# Patient Record
Sex: Male | Born: 1935 | Race: White | Hispanic: No | Marital: Married | Smoking: Never smoker
Health system: Southern US, Community
[De-identification: ages and names within clinical notes are randomized; demographics above are authoritative.]

## PROBLEM LIST (undated history)

## (undated) DIAGNOSIS — I509 Heart failure, unspecified: Secondary | ICD-10-CM

## (undated) DIAGNOSIS — B029 Zoster without complications: Secondary | ICD-10-CM

## (undated) DIAGNOSIS — I4891 Unspecified atrial fibrillation: Secondary | ICD-10-CM

## (undated) DIAGNOSIS — I2699 Other pulmonary embolism without acute cor pulmonale: Secondary | ICD-10-CM

## (undated) DIAGNOSIS — I10 Essential (primary) hypertension: Secondary | ICD-10-CM

## (undated) DIAGNOSIS — E785 Hyperlipidemia, unspecified: Secondary | ICD-10-CM

## (undated) DIAGNOSIS — E039 Hypothyroidism, unspecified: Secondary | ICD-10-CM

## (undated) HISTORY — PX: TONSILLECTOMY: SUR1361

---

## 2002-08-17 ENCOUNTER — Ambulatory Visit (HOSPITAL_COMMUNITY): Admission: RE | Admit: 2002-08-17 | Discharge: 2002-08-17 | Payer: Self-pay | Admitting: Gastroenterology

## 2004-07-01 ENCOUNTER — Emergency Department (HOSPITAL_COMMUNITY): Admission: EM | Admit: 2004-07-01 | Discharge: 2004-07-01 | Payer: Self-pay | Admitting: Emergency Medicine

## 2011-02-01 ENCOUNTER — Emergency Department (HOSPITAL_COMMUNITY): Payer: Medicare Other

## 2011-02-01 ENCOUNTER — Emergency Department (HOSPITAL_COMMUNITY)
Admission: EM | Admit: 2011-02-01 | Discharge: 2011-02-02 | Disposition: A | Discharge status: Home / Self Care | Payer: Medicare Other | Attending: Emergency Medicine | Admitting: Emergency Medicine

## 2011-02-01 DIAGNOSIS — Z79899 Other long term (current) drug therapy: Secondary | ICD-10-CM | POA: Insufficient documentation

## 2011-02-01 DIAGNOSIS — H409 Unspecified glaucoma: Secondary | ICD-10-CM | POA: Insufficient documentation

## 2011-02-01 DIAGNOSIS — I4891 Unspecified atrial fibrillation: Secondary | ICD-10-CM | POA: Insufficient documentation

## 2011-02-01 DIAGNOSIS — Z7901 Long term (current) use of anticoagulants: Secondary | ICD-10-CM | POA: Insufficient documentation

## 2011-02-01 DIAGNOSIS — E7800 Pure hypercholesterolemia, unspecified: Secondary | ICD-10-CM | POA: Insufficient documentation

## 2011-02-01 DIAGNOSIS — I499 Cardiac arrhythmia, unspecified: Secondary | ICD-10-CM | POA: Insufficient documentation

## 2011-02-01 DIAGNOSIS — Z86718 Personal history of other venous thrombosis and embolism: Secondary | ICD-10-CM | POA: Insufficient documentation

## 2011-02-01 DIAGNOSIS — R079 Chest pain, unspecified: Secondary | ICD-10-CM | POA: Insufficient documentation

## 2011-02-01 DIAGNOSIS — I10 Essential (primary) hypertension: Secondary | ICD-10-CM | POA: Insufficient documentation

## 2011-02-01 LAB — POCT I-STAT, CHEM 8
Calcium, Ion: 1.18 mmol/L (ref 1.12–1.32)
Glucose, Bld: 129 mg/dL — ABNORMAL HIGH (ref 70–99)
HCT: 44 % (ref 39.0–52.0)
Hemoglobin: 15 g/dL (ref 13.0–17.0)

## 2011-02-02 LAB — CK TOTAL AND CKMB (NOT AT ARMC)
CK, MB: 2 ng/mL (ref 0.3–4.0)
Total CK: 71 U/L (ref 7–232)

## 2011-02-02 LAB — D-DIMER, QUANTITATIVE: D-Dimer, Quant: 0.39 ug/mL-FEU (ref 0.00–0.48)

## 2011-02-02 LAB — TROPONIN I: Troponin I: 0.02 ng/mL (ref 0.00–0.06)

## 2011-02-02 LAB — TSH: TSH: 0.856 u[IU]/mL (ref 0.350–4.500)

## 2011-02-02 LAB — POCT CARDIAC MARKERS
CKMB, poc: 1.2 ng/mL (ref 1.0–8.0)
Troponin i, poc: <0.05 ng/mL (ref 0.00–0.09)

## 2011-02-02 LAB — LIPID PANEL
Cholesterol: 116 mg/dL (ref 0–200)
LDL Cholesterol: 33 mg/dL (ref 0–99)
Total CHOL/HDL Ratio: 1.5 RATIO
Triglycerides: 42 mg/dL (ref <150)

## 2011-02-02 LAB — APTT: aPTT: 43 seconds — ABNORMAL HIGH (ref 24–37)

## 2011-02-18 ENCOUNTER — Inpatient Hospital Stay: Admission: AD | Admit: 2011-02-18 | Payer: Self-pay | Source: Ambulatory Visit | Admitting: Interventional Cardiology

## 2011-02-20 ENCOUNTER — Inpatient Hospital Stay (HOSPITAL_COMMUNITY): Payer: Medicare Other

## 2011-02-20 ENCOUNTER — Inpatient Hospital Stay (HOSPITAL_COMMUNITY)
Admission: AD | Admit: 2011-02-20 | Discharge: 2011-02-24 | DRG: 292 | Disposition: A | Discharge status: Home / Self Care | Payer: Medicare Other | Source: Ambulatory Visit | Attending: Interventional Cardiology | Admitting: Interventional Cardiology

## 2011-02-20 DIAGNOSIS — I509 Heart failure, unspecified: Secondary | ICD-10-CM | POA: Diagnosis present

## 2011-02-20 DIAGNOSIS — I4891 Unspecified atrial fibrillation: Secondary | ICD-10-CM | POA: Diagnosis present

## 2011-02-20 DIAGNOSIS — Z7982 Long term (current) use of aspirin: Secondary | ICD-10-CM

## 2011-02-20 DIAGNOSIS — R609 Edema, unspecified: Secondary | ICD-10-CM | POA: Diagnosis present

## 2011-02-20 DIAGNOSIS — I5031 Acute diastolic (congestive) heart failure: Principal | ICD-10-CM | POA: Diagnosis present

## 2011-02-20 DIAGNOSIS — I319 Disease of pericardium, unspecified: Secondary | ICD-10-CM | POA: Diagnosis present

## 2011-02-20 DIAGNOSIS — E871 Hypo-osmolality and hyponatremia: Secondary | ICD-10-CM | POA: Diagnosis present

## 2011-02-20 LAB — DIFFERENTIAL
Lymphs Abs: 0.7 10*3/uL (ref 0.7–4.0)
Monocytes Absolute: 1.4 10*3/uL — ABNORMAL HIGH (ref 0.1–1.0)
Monocytes Relative: 19 % — ABNORMAL HIGH (ref 3–12)
Neutro Abs: 5.2 10*3/uL (ref 1.7–7.7)

## 2011-02-20 LAB — COMPREHENSIVE METABOLIC PANEL
Alkaline Phosphatase: 186 U/L — ABNORMAL HIGH (ref 39–117)
BUN: 18 mg/dL (ref 6–23)
CO2: 31 mEq/L (ref 19–32)
Calcium: 9.4 mg/dL (ref 8.4–10.5)
GFR calc Af Amer: >60 mL/min (ref >60)
GFR calc non Af Amer: >60 mL/min (ref >60)
Potassium: 4 mEq/L (ref 3.5–5.1)
Total Protein: 6.7 g/dL (ref 6.0–8.3)

## 2011-02-20 LAB — CBC: MCHC: 31.9 g/dL (ref 30.0–36.0)

## 2011-02-20 LAB — PROTIME-INR
INR: 4.93 — ABNORMAL HIGH (ref 0.00–1.49)
Prothrombin Time: 45.7 seconds — ABNORMAL HIGH (ref 11.6–15.2)

## 2011-02-21 LAB — CBC
HCT: 36.2 % — ABNORMAL LOW (ref 39.0–52.0)
MCH: 31 pg (ref 26.0–34.0)
MCV: 96 fL (ref 78.0–100.0)
Platelets: 180 10*3/uL (ref 150–400)
RDW: 13.8 % (ref 11.5–15.5)
WBC: 6.1 10*3/uL (ref 4.0–10.5)

## 2011-02-21 LAB — BASIC METABOLIC PANEL
BUN: 16 mg/dL (ref 6–23)
CO2: 31 mEq/L (ref 19–32)
Chloride: 91 mEq/L — ABNORMAL LOW (ref 96–112)
Creatinine, Ser: 0.88 mg/dL (ref 0.4–1.5)
Potassium: 3.8 mEq/L (ref 3.5–5.1)
Sodium: 128 mEq/L — ABNORMAL LOW (ref 135–145)

## 2011-02-21 LAB — PROTIME-INR: Prothrombin Time: 41.8 seconds — ABNORMAL HIGH (ref 11.6–15.2)

## 2011-02-22 LAB — BASIC METABOLIC PANEL
BUN: 18 mg/dL (ref 6–23)
Calcium: 8.7 mg/dL (ref 8.4–10.5)
Chloride: 91 mEq/L — ABNORMAL LOW (ref 96–112)
Creatinine, Ser: 0.96 mg/dL (ref 0.4–1.5)
Glucose, Bld: 109 mg/dL — ABNORMAL HIGH (ref 70–99)
Potassium: 4.2 mEq/L (ref 3.5–5.1)

## 2011-02-22 LAB — CBC
Hemoglobin: 12.2 g/dL — ABNORMAL LOW (ref 13.0–17.0)
Platelets: 159 10*3/uL (ref 150–400)
WBC: 6.5 10*3/uL (ref 4.0–10.5)

## 2011-02-22 NOTE — Consult Note (Signed)
NAME:  Jason Ritter, Jason Ritter NO.:  [id number]  MEDICAL RECORD NO.:  [id number]           PATIENT TYPE:  E  LOCATION:  [facility]                         FACILITY:  [facility]  PHYSICIAN:  Della Goo, M.D. DATE OF BIRTH:  July 13, 1929  DATE OF CONSULTATION:  02/02/2011 DATE OF DISCHARGE:  02/02/2011                                CONSULTATION   PRIMARY CARE PHYSICIAN:  Veverly Fells. Altheimer, MD  CHIEF COMPLAINT:  Chest pain or shortness of breath.  HISTORY OF PRESENT ILLNESS:  This is a 77 year old male with a history of chronic atrial fibrillation, previous pulmonary embolism on chronic Coumadin therapy, who presents to the emergency department with complaints of worsening shortness of breath and chest pain.  The patient reports he has dyspnea on exertion that he has had for over a year.  He came into the emergency department secondary to chest discomfort, he described as chest heaviness as well as shortness of breath and when his O2 saturations were checked, he was found to be 89% in the emergency department.  The patient was placed on supplemental oxygen.  He was evaluated in the emergency department and a chest x-ray was performed as well as laboratory studies.  The chest x-ray did reveal vascular congestion and mild interstitial edema.  The patient was administered 40 mg of IV Lasix x1 dose and referred for medical admission.  His admission laboratory studies that were performed included a D-dimer which returned at 0.39 and point-of-care cardiac markers which returned negative with a myoglobin of 81.0, CK-MB 1.2, and a troponin of less than 0.05.  EKG revealed atrial fibrillation, but no acute ST-segment changes are seen.  The rate was 106.  The patient began to feel improvement after the IV Lasix had been given.  At the time of my evaluation, the patient was feeling better and wished to be discharged to home and follow up with his primary care physician, Dr.  Leslie Dales.  PAST MEDICAL HISTORY:  Significant for: 1. Chronic atrial fibrillation. 2. Hypertension. 3. Pulmonary embolism in 2006, on chronic Coumadin therapy. 4. Glaucoma. 5. Benign essential tremors. 6. Right bundle-branch block. 7. Hyperlipidemia. 8. The patient also has chronic venous insufficiency. 9. Diastolic congestive heart failure. 10.Hypothyroidism.  PAST SURGICAL HISTORY: 1. History of a tonsillectomy and adenoidectomy. 2. Rhinoplasty.  Medications at this time include amlodipine, Diovan, doxazosin, latanoprost ophthalmic drops, Lipitor, lisinopril, melatonin, metoprolol tartrate, PreserVision ophthalmic drops, stool softener, Synthroid, torsemide, and Coumadin.  ALLERGIES:  No known drug allergies.  SOCIAL HISTORY:  The patient is married.  He is a nonsmoker.  He does report drinking 6-8 ounces of gin or tequila nightly.  He denies any illicit drug usage.  FAMILY HISTORY:  Positive for coronary artery disease in his father, positive for cerebrovascular accident in his mother.  No cancer or diabetes in his family.  REVIEW OF SYSTEMS:  Pertinents as mentioned above.  All other organ systems are negative.  PHYSICAL EXAMINATION FINDINGS:  GENERAL:  This is a 77 year old elderly obese Caucasian male who is in no acute distress currently. VITAL SIGNS:  Temperature 98.1, blood pressure 135/89, heart rate  109, respirations 24, O2 saturations 89% initially, now 94%. HEENT:  Normocephalic, atraumatic.  Pupils equal, round, reactive to light.  Extraocular movements are intact.  Funduscopic benign.  There is no scleral icterus.  Nares are patent bilaterally.  Oropharynx is clear. NECK:  Supple.  Full range of motion.  No thyromegaly, adenopathy, or jugular venous distention. CARDIOVASCULAR:  Regular rate and rhythm.  No murmurs, gallops, or rubs. CHEST:  Chest wall nontender.  Normal symmetric excursion of both lung field areas.  Mild bibasilar rales.  No rhonchi, no  wheezes. ABDOMEN:  Positive bowel sounds, soft, obese, nontender, nondistended. No hepatosplenomegaly. EXTREMITIES:  With 3+ edema, bilateral lower extremities, the patient reports this is chronic. NEUROLOGIC:  Nonfocal.  LABORATORY STUDIES:  Hemoglobin 15.0, hematocrit 44.0.  Sodium 132, potassium 4.5, chloride 96, CO2 of 28, BUN 19, creatinine 1.1, and glucose 129.  D-dimer 0.39.  Point-of-care cardiac markers with a myoglobin of 81.0, CK-MB 1.2, troponin less than 0.05.  Cardiac enzymes with a total CK of 71, CK-MB 2.0, and troponin 0.02.  Protime 28.8, INR 2.70, and PTT 43.  Cholesterol panel; total cholesterol 116, LDL 33, HDL 75, triglycerides 42.  ASSESSMENT: 87. A 77 year old male being seen in the emergency department secondary     to shortness of breath which is most likely secondary to an     exacerbation of his chronic diastolic congestive heart failure     syndrome. 2. Chest pain. 3. Chronic atrial fibrillation. 4. Coagulopathy secondary to Coumadin therapy for a previous pulmonary     embolism and the atrial fibrillation.   DICTATION ENDED AT THIS POINT     Della Goo, M.D.    HJ/MEDQ  D:  02/02/2011  T:  02/02/2011  Job:  [id number]  cc:   Veverly Fells. Altheimer, M.D.  Electronically Signed by Della Goo M.D. on 02/22/2011 07:46:42 PM

## 2011-02-22 NOTE — Consult Note (Signed)
  NAME:  Jason Ritter, Jason Ritter NO.:  [id number]  MEDICAL RECORD NO.:  [id number]           PATIENT TYPE:  E  LOCATION:  [facility]                         FACILITY:  [facility]  PHYSICIAN:  Della Goo, M.D. DATE OF BIRTH:  01-24-1929  DATE OF CONSULTATION:  02/02/2011 DATE OF DISCHARGE:  02/02/2011                                CONSULTATION   ADDENDUM:  DATE OF SERVICE:  February 02, 2011.  PRIMARY CARE PHYSICIAN:  Veverly Fells. Altheimer, M.D.  PLAN:  The patient had been administered 40 mg of IV Lasix x1 dose in the emergency department and had prompt diuresis and felt better.  The patient was hemodynamically stable and did not wish to continue the cardiac workup as an inpatient.  The patient preferred to follow up with his primary care physician and could not be convinced to remain otherwise.  Recommendations are being made for the patient to have a repeat 2-D echo study performed to compare to the previous 2-D echo that patient reports having in 2006 where he stated he had an ejection fraction of 55%.  Further evaluation and referrals can be made by the patient's primary care physician.  The patient has been strongly advised to follow up with his primary care physician on Monday morning and has been also advised to return to the emergency department if his symptoms worsen.     Della Goo, M.D.     HJ/MEDQ  D:  02/02/2011  T:  02/02/2011  Job:  [id number]  cc:   Veverly Fells. Altheimer, M.D.  Electronically Signed by Della Goo M.D. on 02/22/2011 07:46:30 PM

## 2011-02-23 LAB — CBC
HCT: 37.4 % — ABNORMAL LOW (ref 39.0–52.0)
Hemoglobin: 12 g/dL — ABNORMAL LOW (ref 13.0–17.0)
MCH: 31 pg (ref 26.0–34.0)
MCHC: 32.1 g/dL (ref 30.0–36.0)

## 2011-02-23 LAB — BASIC METABOLIC PANEL
BUN: 17 mg/dL (ref 6–23)
Chloride: 90 mEq/L — ABNORMAL LOW (ref 96–112)
Glucose, Bld: 109 mg/dL — ABNORMAL HIGH (ref 70–99)
Potassium: 4.5 mEq/L (ref 3.5–5.1)

## 2011-02-24 ENCOUNTER — Inpatient Hospital Stay (HOSPITAL_COMMUNITY): Payer: Medicare Other

## 2011-02-24 LAB — CBC
HCT: 39.2 % (ref 39.0–52.0)
Hemoglobin: 13.3 g/dL (ref 13.0–17.0)
MCV: 94.9 fL (ref 78.0–100.0)
RBC: 4.13 MIL/uL — ABNORMAL LOW (ref 4.22–5.81)
WBC: 11.8 10*3/uL — ABNORMAL HIGH (ref 4.0–10.5)

## 2011-02-24 LAB — BASIC METABOLIC PANEL
BUN: 18 mg/dL (ref 6–23)
Chloride: 86 mEq/L — ABNORMAL LOW (ref 96–112)
Glucose, Bld: 105 mg/dL — ABNORMAL HIGH (ref 70–99)
Potassium: 3.1 mEq/L — ABNORMAL LOW (ref 3.5–5.1)

## 2011-03-05 NOTE — Discharge Summary (Signed)
Jason Ritter, Jason Ritter                 ACCOUNT NO.:  [id number]  MEDICAL RECORD NO.:  [id number]           PATIENT TYPE:  I  LOCATION:  4711                         FACILITY:  [facility]  PHYSICIAN:  Corky Crafts, MDDATE OF BIRTH:  [birth year]  DATE OF ADMISSION:  02/20/2011 DATE OF DISCHARGE:  02/24/2011                              DISCHARGE SUMMARY   FINAL DIAGNOSES: 1. Atrial fibrillation. 2. Acute diastolic heart failure. 3. Shortness of breath. 4. Edema.  PROCEDURES PERFORMED:  Chest x-ray showing large left pleural effusion and interstitial edema on Feb 20, 2011.  Followup chest x-ray on Feb 24, 2011, showing persistent left-sided effusion.  HOSPITAL COURSE:  The patient was admitted due to shortness of breath and significant fluid overload.  He started having issues back at the end of April.  He was seen in the emergency room and sent home.  At that time, he had a small left-sided pleural effusion.  He was seen in the office twice subsequently.  The second time, he was grossly fluid overloaded and we recommended admission to the hospital because of the social situation at home where he is the primary caretaker for his wife who has chronic illness, he declined.  He came back to the office 2 days later and had made arrangements for his wife.  He was ready to go into the hospital.  He was feeling worse.  He was admitted and started on intravenous Lasix.  He diuresed well and lost several pounds each day while he was in the hospital.  He had to have his potassium replaced. His atrial fibrillation was rate controlled.  His Coumadin was supratherapeutic, it was held for several days and then came back to the normal range.  He insisted on being discharged on Feb 24, 2011.  He had discussed leaving against medical advice with the nurse.  He understood that he still had a lot of excess fluid in his system, but felt like he had lost enough that he could go home and care for his  wife.  His INR had fallen to 1.8 at that time, but several doses had been held and he still wanted to go home.  He was willing to have close outpatient followup, therefore I agree to send him on oral diuretic.  Of note in the hospital, he responded very well to metolazone.  This seemed to be in combination with Lasix caused the greatest and most effective diuresis for the patient.  DISCHARGE MEDICATIONS: 1. Potassium 40 mEq daily. 2. Metolazone 2.5 mg p.o. every other day. 3. Metoprolol succinate 100 mg daily. 4. Doxazosin 8 mg every night. 5. He is to stop taking lisinopril. 6. He is to stop taking amlodipine. 7. Synthroid 112 mcg daily. 8. Lipitor 20 mg daily. 9. Warfarin at previous dose. 10.Latanoprost eye drops. 11.PreserVision. 12.Docusate. 13.Melatonin. 14.Lasix 80 mg p.o. b.i.d. 15.Fish oil 1000 mg b.i.d. 16.Aspirin 325 mg daily. 17.Restoril 30 mg daily.  FOLLOWUP APPOINTMENTS:  With Dr. Hoyle Barr office on Feb 27, 2011, at 9:15 a.m.  He will have lab work at that time.  DIET:  Low-salt, heart-healthy diet.  ACTIVITY:  Increase activity slowly.  Of note, we did have a social work consult while he was in the hospital. He was not interested in any services that might help relocate him and his wife to perhaps an independent living facility.  He wants to live at home, although it seems that his home situation is at least suboptimal. Home health was also offered, which he refused.  We will follow him up in the office and try and maintain him as an outpatient.  Also please note that he did have an echocardiogram a few weeks ago in our office showing normal left ventricular ejection fraction.  Greater than 30 minutes was spent on this discharge, mostly because of trying to sort out the social difficulties that he has at home.     Corky Crafts, MD     JSV/MEDQ  D:  02/25/2011  T:  02/25/2011  Job:  [id number]  Electronically Signed by Lance Muss MD  on 03/05/2011 05:03:19 PM

## 2011-09-03 ENCOUNTER — Inpatient Hospital Stay (HOSPITAL_COMMUNITY)
Admission: EM | Admit: 2011-09-03 | Discharge: 2011-09-05 | DRG: 556 | Disposition: A | Discharge status: Home / Self Care | Payer: Medicare Other | Source: Ambulatory Visit | Attending: Internal Medicine | Admitting: Internal Medicine

## 2011-09-03 ENCOUNTER — Emergency Department (HOSPITAL_COMMUNITY): Payer: Medicare Other

## 2011-09-03 ENCOUNTER — Other Ambulatory Visit: Payer: Self-pay

## 2011-09-03 ENCOUNTER — Encounter: Payer: Self-pay | Admitting: *Deleted

## 2011-09-03 DIAGNOSIS — J90 Pleural effusion, not elsewhere classified: Secondary | ICD-10-CM

## 2011-09-03 DIAGNOSIS — I4891 Unspecified atrial fibrillation: Secondary | ICD-10-CM

## 2011-09-03 DIAGNOSIS — R29898 Other symptoms and signs involving the musculoskeletal system: Principal | ICD-10-CM

## 2011-09-03 DIAGNOSIS — E785 Hyperlipidemia, unspecified: Secondary | ICD-10-CM | POA: Diagnosis present

## 2011-09-03 DIAGNOSIS — E039 Hypothyroidism, unspecified: Secondary | ICD-10-CM | POA: Diagnosis present

## 2011-09-03 DIAGNOSIS — I10 Essential (primary) hypertension: Secondary | ICD-10-CM | POA: Diagnosis present

## 2011-09-03 DIAGNOSIS — Z86718 Personal history of other venous thrombosis and embolism: Secondary | ICD-10-CM

## 2011-09-03 DIAGNOSIS — R531 Weakness: Secondary | ICD-10-CM

## 2011-09-03 DIAGNOSIS — I509 Heart failure, unspecified: Secondary | ICD-10-CM

## 2011-09-03 DIAGNOSIS — B029 Zoster without complications: Secondary | ICD-10-CM

## 2011-09-03 DIAGNOSIS — I5032 Chronic diastolic (congestive) heart failure: Secondary | ICD-10-CM | POA: Diagnosis present

## 2011-09-03 HISTORY — DX: Unspecified atrial fibrillation: I48.91

## 2011-09-03 HISTORY — DX: Heart failure, unspecified: I50.9

## 2011-09-03 HISTORY — DX: Other pulmonary embolism without acute cor pulmonale: I26.99

## 2011-09-03 HISTORY — DX: Zoster without complications: B02.9

## 2011-09-03 HISTORY — DX: Essential (primary) hypertension: I10

## 2011-09-03 HISTORY — DX: Hypothyroidism, unspecified: E03.9

## 2011-09-03 HISTORY — DX: Hyperlipidemia, unspecified: E78.5

## 2011-09-03 LAB — BASIC METABOLIC PANEL
BUN: 18 mg/dL (ref 6–23)
CO2: 27 mEq/L (ref 19–32)
Calcium: 9.2 mg/dL (ref 8.4–10.5)
Creatinine, Ser: 1.13 mg/dL (ref 0.50–1.35)

## 2011-09-03 LAB — CBC
HCT: 41.4 % (ref 39.0–52.0)
MCHC: 32.9 g/dL (ref 30.0–36.0)
MCV: 99.8 fL (ref 78.0–100.0)
RDW: 13.9 % (ref 11.5–15.5)
WBC: 6 10*3/uL (ref 4.0–10.5)

## 2011-09-03 LAB — PRO B NATRIURETIC PEPTIDE: Pro B Natriuretic peptide (BNP): 1988 pg/mL — ABNORMAL HIGH (ref 0–450)

## 2011-09-03 LAB — DIFFERENTIAL
Basophils Absolute: 0 10*3/uL (ref 0.0–0.1)
Eosinophils Relative: 1 % (ref 0–5)
Lymphocytes Relative: 26 % (ref 12–46)
Monocytes Absolute: 1 10*3/uL (ref 0.1–1.0)

## 2011-09-03 LAB — URINALYSIS, ROUTINE W REFLEX MICROSCOPIC
Glucose, UA: NEGATIVE mg/dL
Hgb urine dipstick: NEGATIVE
Specific Gravity, Urine: 1.012 (ref 1.005–1.030)
pH: 6.5 (ref 5.0–8.0)

## 2011-09-03 LAB — POCT I-STAT TROPONIN I: Troponin i, poc: 0.01 ng/mL (ref 0.00–0.08)

## 2011-09-03 NOTE — ED Notes (Signed)
Fell last night, today  Feels like his legs are "melting" and going away from him. Active shingles right flank area and abd

## 2011-09-03 NOTE — ED Provider Notes (Signed)
History    patient with history of CHF, and atrial fibrillations, is presenting to the ED with a chief complaint of weakness. Patient states for the past 2 days he has been having increase lower extremity weakness. He first notices weakness while he was standing, and he felt as if " my leg were melting away".  He has to sit down on the ground and was unable to get up. At that time, he denies falling, hitting his head, or loss of consciousness. He has to call for his wife to get him up. Today, he experienced the same symptoms with both legs giving out on him. He has to lay himself to the ground and unable to get up. Also has bowel incontinence while he was on the ground. He once again denies any fever, headache, chest pain, shortness of breath, nausea, vomiting, diarrhea, abdominal pain, numbness. Patient however, states he is taking Lasix but hadn't notice urinary frequency as he normally does. He complains of burning on urination and urinary urgency without frequency.  This is new.    Patient also states that he has a rash to his right flank that has been ongoing for the past several days. He has been seen by a dermatologist and was diagnosed with shingles. He is currently taking Valtrex, Percocet, and Neurontin. He denies significant pain radiating to his shingles.  CSN: [id number] Arrival date & time: 09/03/2011  9:44 PM   First MD Initiated Contact with Patient 09/03/11 2145      Chief Complaint  Patient presents with  . Weakness    (Consider location/radiation/quality/duration/timing/severity/associated sxs/prior treatment) Patient is a 77 y.o. male presenting with weakness. The history is provided by the patient. No language interpreter was used.  Weakness Primary symptoms do not include headaches, loss of consciousness, altered mental status, dizziness or fever. The symptoms began yesterday. The symptoms are unchanged.  Additional symptoms include weakness. Additional symptoms do not  include loss of balance or tinnitus.    Past Medical History  Diagnosis Date  . CHF (congestive heart failure)   . Afib   . Shingles   . Pulmonary embolism     History reviewed. No pertinent past surgical history.  No family history on file.  History  Substance Use Topics  . Smoking status: Not on file  . Smokeless tobacco: Not on file  . Alcohol Use:       Review of Systems  Constitutional: Negative for fever.  HENT: Negative for tinnitus.   Neurological: Positive for weakness. Negative for dizziness, loss of consciousness, headaches and loss of balance.  Psychiatric/Behavioral: Negative for altered mental status.  All other systems reviewed and are negative.    Allergies  Review of patient's allergies indicates not on file.  Home Medications  No current outpatient prescriptions on file.  BP 113/73  Pulse 82  Temp(Src) 99 F (37.2 C) (Oral)  Resp 23  SpO2 94%  Physical Exam  Nursing note and vitals reviewed. Constitutional: He is oriented to person, place, and time. He appears well-developed and well-nourished.       Awake, alert, nontoxic appearance  HENT:  Head: Normocephalic and atraumatic.  Mouth/Throat: Oropharynx is clear and moist.  Eyes: Conjunctivae and EOM are normal. Pupils are equal, round, and reactive to light. Right eye exhibits no discharge. Left eye exhibits no discharge. No scleral icterus.  Neck: Neck supple.  Cardiovascular: An irregular rhythm present.  Pulmonary/Chest: Effort normal. He exhibits no tenderness.  Abdominal: There is no tenderness. There is  no rebound.  Musculoskeletal: He exhibits no tenderness.       Right ankle: He exhibits swelling. He exhibits normal range of motion. no tenderness.       Left ankle: He exhibits swelling. He exhibits normal range of motion. no tenderness.       Baseline ROM, no obvious new focal weakness  Lymphadenopathy:    He has no cervical adenopathy.  Neurological: He is alert and oriented  to person, place, and time. He displays tremor. No cranial nerve deficit or sensory deficit. He exhibits normal muscle tone. He displays a negative Romberg sign. Gait abnormal. Coordination normal. GCS eye subscore is 4. GCS verbal subscore is 5.       Mental status and motor strength appears baseline for patient and situation  Skin: No rash noted.     Psychiatric: He has a normal mood and affect. His speech is normal and behavior is normal. Judgment and thought content normal. Cognition and memory are normal.    ED Course  Procedures (including critical care time)  Labs Reviewed - No data to display No results found.   No diagnosis found.   Date: 09/03/2011  Rate: 84  Rhythm: atrial fibrillation  QRS Axis: normal  Intervals: normal  ST/T Wave abnormalities: normal  Conduction Disutrbances:right bundle branch block  Narrative Interpretation:   Old EKG Reviewed: unchanged    MDM  Patient with new onset of weakness.  UA is negative for infection.  INR is subtherapeutic at 1.45.  CBC is negative for anemia or infection.     Patient has several bouts of bowel incontinence. Rectal exam reveals decreased rectal tone. Patient has no focal neuro deficits. This symptom is concerning for orthostatic hypotension. I will check an orthostatic vital sign. Today's chest x-ray shows recurrent large left pleural effusion with associated passive atelectasis or pneumonia left lower lobe.  I doubt that this is pneumonia as patient is afebrile, with an normal CBC, and no cough.    12:38 AM i've discussed with [facility], who has agreed to admit pt.  I also discussed with neurologist, Dr. Pearlean Brownie, who has agreed to f/u with pt in hospital once admitted.  Pt has some sensation of urinary retention with bowel incontinence.  However, no back pain.  Mild decreased rectal tone with normal rectal sensation.    Pt is mildly dehydrated but no obvious orthostasis.  Does have unsteady gait.  Pt will be  admitted.       Fayrene Helper, PA 09/04/11 0100  Fayrene Helper, PA 09/04/11 0100

## 2011-09-04 ENCOUNTER — Encounter (HOSPITAL_COMMUNITY): Payer: Self-pay | Admitting: Internal Medicine

## 2011-09-04 ENCOUNTER — Inpatient Hospital Stay (HOSPITAL_COMMUNITY): Payer: Medicare Other

## 2011-09-04 DIAGNOSIS — J90 Pleural effusion, not elsewhere classified: Secondary | ICD-10-CM | POA: Diagnosis present

## 2011-09-04 DIAGNOSIS — I509 Heart failure, unspecified: Secondary | ICD-10-CM | POA: Diagnosis present

## 2011-09-04 DIAGNOSIS — R29898 Other symptoms and signs involving the musculoskeletal system: Secondary | ICD-10-CM | POA: Diagnosis present

## 2011-09-04 DIAGNOSIS — I4891 Unspecified atrial fibrillation: Secondary | ICD-10-CM | POA: Diagnosis present

## 2011-09-04 LAB — COMPREHENSIVE METABOLIC PANEL
ALT: 24 U/L (ref 0–53)
AST: 78 U/L — ABNORMAL HIGH (ref 0–37)
Alkaline Phosphatase: 121 U/L — ABNORMAL HIGH (ref 39–117)
CO2: 27 mEq/L (ref 19–32)
Chloride: 99 mEq/L (ref 96–112)
GFR calc Af Amer: 90 mL/min — ABNORMAL LOW (ref >90)
GFR calc non Af Amer: 77 mL/min — ABNORMAL LOW (ref >90)
Glucose, Bld: 115 mg/dL — ABNORMAL HIGH (ref 70–99)
Potassium: 3.9 mEq/L (ref 3.5–5.1)
Sodium: 136 mEq/L (ref 135–145)

## 2011-09-04 LAB — VITAMIN B12: Vitamin B-12: 453 pg/mL (ref 211–911)

## 2011-09-04 LAB — CBC
HCT: 41.1 % (ref 39.0–52.0)
MCH: 33 pg (ref 26.0–34.0)
MCHC: 33.1 g/dL (ref 30.0–36.0)
RDW: 14 % (ref 11.5–15.5)

## 2011-09-04 LAB — PROTIME-INR
INR: 1.47 (ref 0.00–1.49)
Prothrombin Time: 18.1 seconds — ABNORMAL HIGH (ref 11.6–15.2)

## 2011-09-04 LAB — RPR: RPR Ser Ql: NONREACTIVE

## 2011-09-04 LAB — SEDIMENTATION RATE: Sed Rate: 38 mm/hr — ABNORMAL HIGH (ref 0–16)

## 2011-09-04 LAB — T4, FREE: Free T4: 1.14 ng/dL (ref 0.80–1.80)

## 2011-09-04 MED ORDER — LORAZEPAM 1 MG PO TABS: 1.0000 mg | ORAL_TABLET | Freq: Four times a day (QID) | ORAL | Status: DC | PRN

## 2011-09-04 MED ORDER — LORAZEPAM 2 MG/ML IJ SOLN: 1.0000 mg | Freq: Four times a day (QID) | INTRAMUSCULAR | Status: DC | PRN

## 2011-09-04 MED ORDER — LORAZEPAM 1 MG PO TABS: 0.0000 mg | ORAL_TABLET | Freq: Four times a day (QID) | ORAL | Status: DC

## 2011-09-04 MED ORDER — THIAMINE HCL 100 MG/ML IJ SOLN
100.0000 mg | Freq: Every day | INTRAMUSCULAR | Status: DC
  Filled 2011-09-04 (×2): qty 1

## 2011-09-04 MED ORDER — ZOLPIDEM TARTRATE 5 MG PO TABS: 5.0000 mg | ORAL_TABLET | Freq: Every evening | ORAL | Status: DC | PRN

## 2011-09-04 MED ORDER — LATANOPROST 0.005 % OP SOLN
1.0000 [drp] | Freq: Every day | OPHTHALMIC | Status: DC
Start: 2011-09-04 — End: 2011-09-05
  Administered 2011-09-04: 1 [drp] via OPHTHALMIC
  Filled 2011-09-04: qty 2.5

## 2011-09-04 MED ORDER — LISINOPRIL 5 MG PO TABS
5.0000 mg | ORAL_TABLET | Freq: Every day | ORAL | Status: DC
  Administered 2011-09-04 – 2011-09-05 (×2): 5 mg via ORAL
  Filled 2011-09-04 (×2): qty 1

## 2011-09-04 MED ORDER — LEVOTHYROXINE SODIUM 112 MCG PO TABS
112.0000 ug | ORAL_TABLET | Freq: Every day | ORAL | Status: DC
  Administered 2011-09-04 – 2011-09-05 (×2): 112 ug via ORAL
  Filled 2011-09-04 (×2): qty 1

## 2011-09-04 MED ORDER — MOXIFLOXACIN HCL IN NACL 400 MG/250ML IV SOLN
400.0000 mg | INTRAVENOUS | Status: DC
  Administered 2011-09-04: 400 mg via INTRAVENOUS
  Filled 2011-09-04 (×2): qty 250

## 2011-09-04 MED ORDER — SIMVASTATIN 20 MG PO TABS
20.0000 mg | ORAL_TABLET | Freq: Every day | ORAL | Status: DC
  Administered 2011-09-04: 20 mg via ORAL
  Filled 2011-09-04 (×2): qty 1

## 2011-09-04 MED ORDER — METOPROLOL SUCCINATE ER 100 MG PO TB24
100.0000 mg | ORAL_TABLET | Freq: Every day | ORAL | Status: DC
  Administered 2011-09-04 – 2011-09-05 (×2): 100 mg via ORAL
  Filled 2011-09-04 (×2): qty 1

## 2011-09-04 MED ORDER — ONDANSETRON HCL 4 MG/2ML IJ SOLN: 4.0000 mg | Freq: Four times a day (QID) | INTRAMUSCULAR | Status: DC | PRN

## 2011-09-04 MED ORDER — THERA M PLUS PO TABS
1.0000 | ORAL_TABLET | Freq: Every day | ORAL | Status: DC
  Administered 2011-09-04 – 2011-09-05 (×2): 1 via ORAL
  Filled 2011-09-04 (×2): qty 1

## 2011-09-04 MED ORDER — VALACYCLOVIR HCL 500 MG PO TABS
1000.0000 mg | ORAL_TABLET | Freq: Three times a day (TID) | ORAL | Status: DC
  Administered 2011-09-04 – 2011-09-05 (×4): 1000 mg via ORAL
  Filled 2011-09-04 (×6): qty 2

## 2011-09-04 MED ORDER — FUROSEMIDE 10 MG/ML IJ SOLN
40.0000 mg | Freq: Two times a day (BID) | INTRAMUSCULAR | Status: DC
  Administered 2011-09-04 – 2011-09-05 (×3): 40 mg via INTRAVENOUS
  Filled 2011-09-04 (×5): qty 4

## 2011-09-04 MED ORDER — TEMAZEPAM 15 MG PO CAPS: 30.0000 mg | ORAL_CAPSULE | Freq: Every evening | ORAL | Status: DC | PRN

## 2011-09-04 MED ORDER — ONDANSETRON HCL 4 MG PO TABS: 4.0000 mg | ORAL_TABLET | Freq: Four times a day (QID) | ORAL | Status: DC | PRN

## 2011-09-04 MED ORDER — ACETAMINOPHEN 325 MG PO TABS: 650.0000 mg | ORAL_TABLET | Freq: Four times a day (QID) | ORAL | Status: DC | PRN

## 2011-09-04 MED ORDER — ACETAMINOPHEN 650 MG RE SUPP: 650.0000 mg | Freq: Four times a day (QID) | RECTAL | Status: DC | PRN

## 2011-09-04 MED ORDER — LORAZEPAM 1 MG PO TABS: 0.0000 mg | ORAL_TABLET | Freq: Two times a day (BID) | ORAL | Status: DC

## 2011-09-04 MED ORDER — FOLIC ACID 1 MG PO TABS
1.0000 mg | ORAL_TABLET | Freq: Every day | ORAL | Status: DC
  Administered 2011-09-04 – 2011-09-05 (×2): 1 mg via ORAL
  Filled 2011-09-04 (×2): qty 1

## 2011-09-04 MED ORDER — SODIUM CHLORIDE 0.9 % IJ SOLN
3.0000 mL | Freq: Two times a day (BID) | INTRAMUSCULAR | Status: DC
  Administered 2011-09-04 – 2011-09-05 (×3): 3 mL via INTRAVENOUS

## 2011-09-04 MED ORDER — VITAMIN B-1 100 MG PO TABS
100.0000 mg | ORAL_TABLET | Freq: Every day | ORAL | Status: DC
  Administered 2011-09-04 – 2011-09-05 (×2): 100 mg via ORAL
  Filled 2011-09-04 (×2): qty 1

## 2011-09-04 NOTE — ED Provider Notes (Signed)
Medical screening examination/treatment/procedure(s) were performed by non-physician practitioner and as supervising physician I was immediately available for consultation/collaboration.  Jasmine Awe, MD 09/04/11 [phone]

## 2011-09-04 NOTE — Progress Notes (Signed)
Subjective: No current complaints.  Objective: Vital signs in last 24 hours: Temp:  [97.5 F (36.4 C)-99 F (37.2 C)] 97.5 F (36.4 C) (11/22 1404) Pulse Rate:  [72-96] 96  (11/22 1404) Resp:  [15-23] 20  (11/22 1404) BP: (113-157)/(73-107) 142/92 mmHg (11/22 1404) SpO2:  [93 %-97 %] 97 % (11/22 1404) Weight:  [95.5 kg (210 lb 8.6 oz)] 210 lb 8.6 oz (95.5 kg) (11/22 0600) Weight change:  Last BM Date: 09/03/11  Intake/Output from previous day: 11/21 0701 - 11/22 0700 In: 364 [P.O.:110; IV Piggyback:254] Out: 550 [Urine:550] Total I/O In: 462 [P.O.:462] Out: 1275 [Urine:1275]   Physical Exam: General: Alert, awake, oriented x3, in no acute distress. HEENT: No bruits, no goiter. Heart: Regular rate and rhythm, without murmurs, rubs, gallops. Lungs: Clear to auscultation bilaterally. Abdomen: Soft, nontender, nondistended, positive bowel sounds. Extremities: No clubbing cyanosis or edema with positive pedal pulses. Neuro: Grossly intact, nonfocal.    Lab Results: Basic Metabolic Panel:  Basename 09/04/11 0600 09/03/11 2205  NA 136 134*  K 3.9 3.8  CL 99 96  CO2 27 27  GLUCOSE 115* 120*  BUN 16 18  CREATININE 0.93 1.13  CALCIUM 8.9 9.2  MG 2.1 --  PHOS -- --   Liver Function Tests:  Basename 09/04/11 0600  AST 78*  ALT 24  ALKPHOS 121*  BILITOT 0.8  PROT 6.7  ALBUMIN 3.3*   CBC:  Basename 09/04/11 0600 09/03/11 2205  WBC 5.2 6.0  NEUTROABS -- 3.4  HGB 13.6 13.6  HCT 41.1 41.4  MCV 99.8 99.8  PLT 123* 136*   BNP:  Basename 09/03/11 2205  POCBNP 1988.0*   Thyroid Function Tests:  Basename 09/04/11 1000  TSH --  T4TOTAL --  FREET4 1.14  T3FREE --  THYROIDAB --   Coagulation:  Basename 09/04/11 0600 09/03/11 2205  LABPROT 18.1* 17.9*  INR 1.47 1.45    Recent Results (from the past 240 hour(s))  MRSA PCR SCREENING     Status: Abnormal   Collection Time   09/04/11  6:15 AM      Component Value Range Status Comment   MRSA by PCR  POSITIVE (*) NEGATIVE  Final     Studies/Results: Dg Chest 2 View  09/03/2011  *RADIOLOGY REPORT*  Clinical Data: Active shingles.  Generalized weakness.  Atrial fibrillation.  History of CHF.  CHEST - 2 VIEW 09/03/2011:  Comparison: Two-view chest x-ray 02/24/2011, 02/20/2011, and portable chest x-ray 02/02/2011 [facility]  Findings: Residual/recurrent large left pleural effusion and associated dense consolidation in the left lower lobe.  Calcified granulomata in the right lower lobe.  Emphysematous changes throughout both lungs.  Lungs otherwise clear.  Pulmonary vascularity normal.  Cardiac silhouette enlarged but stable. Thoracic aorta mildly tortuous and atherosclerotic, unchanged. Hilar and mediastinal contours otherwise unremarkable. Degenerative changes throughout the thoracic spine.  IMPRESSION: Residual/recurrent large left pleural effusion and associated dense passive atelectasis and/or pneumonia in the left lower lobe. Stable cardiomegaly without pulmonary edema.  COPD/emphysema.  Old granulomatous disease.  Original Report Authenticated By: Arnell Sieving, M.D.   Ct Head Wo Contrast  09/03/2011  *RADIOLOGY REPORT*  Clinical Data: Generalized weakness.  Multiple recent falls. Shingles.  CT HEAD WITHOUT CONTRAST 09/03/2011:  Technique:  Contiguous axial images were obtained from the base of the skull through the vertex without contrast.  Comparison: None.  Findings: Motion degraded several of the images initially but these were repeated and a diagnostic study was obtained.  Moderate to severe cortical  atrophy, severe deep atrophy, and moderate cerebellar atrophy.  Mild changes of small vessel disease of the white matter diffusely. No mass lesion.  No midline shift. No acute hemorrhage or hematoma.  No extra-axial fluid collections. No evidence of acute infarction.  No focal osseous abnormality involving the skull.  Visualized paranasal sinuses, mastoid air cells, and middle ear  cavities well- aerated.  Bilateral carotid siphon atherosclerosis.  IMPRESSION:  1.  No acute intracranial abnormality. 2.  Moderate to severe generalized atrophy and mild chronic microvascular ischemic changes of the white matter.  Original Report Authenticated By: Arnell Sieving, M.D.   Ct Chest Wo Contrast  09/04/2011  *RADIOLOGY REPORT*  Clinical Data: Shortness of breath.  Left pleural effusion.  CT CHEST WITHOUT CONTRAST  Technique:  Multidetector CT imaging of the chest was performed following the standard protocol without IV contrast.  Comparison: None.  Findings: A moderate-to-large left pleural effusion is seen with compressive atelectasis of the left lower lobe.  A tiny right pleural effusion is seen.  There is no evidence of pulmonary consolidation or mass.  No evidence of centrally obstructing mass or lymphadenopathy on this noncontrast study.  Shotty mediastinal lymph nodes are seen, none of which are pathologically enlarged. No adenopathy seen elsewhere within the thorax.  Mild cardiomegaly noted with mild coronary artery calcification. No evidence of pericardial effusion.  Right lower lobe calcified granuloma and right hilar lymph node calcifications, consistent with old granulomatous disease.  Several noncalcified pulmonary nodules are seen in the right lung, largest in the right lower lobe measuring 7 mm on image 47.  IMPRESSION:  1.  Moderate-to-large left pleural effusion and left lower lobe compressive atelectasis. 2.  No evidence of centrally obstructing mass or lymphadenopathy. 3.  Several noncalcified indeterminate right lung nodules, largest measuring 7 mm. If the patient is at high risk for bronchogenic carcinoma, follow-up chest CT at 3-6 months is recommended.  If the patient is at low risk for bronchogenic carcinoma, follow-up chest CT at 6-12 months is recommended.  This recommendation follows the consensus statement: Guidelines for Management of Small Pulmonary Nodules Detected  on CT Scans: A Statement from the Fleischner Society as published in Radiology 2005; 237:395-400. Online at: DietDisorder.cz.  Original Report Authenticated By: Danae Orleans, M.D.    Medications: Scheduled Meds:   . folic acid  1 mg Oral Daily  . furosemide  40 mg Intravenous Q12H  . latanoprost  1 drop Left Eye QHS  . levothyroxine  112 mcg Oral Daily  . lisinopril  5 mg Oral Daily  . LORazepam  0-4 mg Oral Q6H   Followed by  . LORazepam  0-4 mg Oral Q12H  . metoprolol  100 mg Oral Daily  . moxifloxacin  400 mg Intravenous Q24H  . multivitamins ther. w/minerals  1 tablet Oral Daily  . simvastatin  20 mg Oral q1800  . sodium chloride  3 mL Intravenous Q12H  . thiamine  100 mg Oral Daily   Or  . thiamine  100 mg Intravenous Daily  . valACYclovir  1,000 mg Oral TID   Continuous Infusions:  PRN Meds:.acetaminophen, acetaminophen, LORazepam, LORazepam, ondansetron (ZOFRAN) IV, ondansetron, zolpidem, DISCONTD: temazepam  Assessment/Plan:  Principal Problem:  *Lower extremity weakness Active Problems:  Pleural effusion on left  CHF (congestive heart failure)  A-fib  #1 bilateral lower extremity weakness: Appreciate neurology input. MRI of thoracic and lumbar spine is pending. I have added a vitamin B 12 level and a TSH. Symptoms coincide with  initiation of Percocet and Neurontin. These medications have already been discontinued. Further recommendations as per neurology. PT and OT evaluations are pending.  #2 large left pleural effusion: He is remarkably asymptomatic. No shortness of breath or chest pain. This is likely a transudative effusion related to his diastolic CHF. He will probably benefit from a thoracentesis prior to discharge. Discontinue Avelox as CT scan is without evidence for pneumonia.  #3 acute on chronic diastolic CHF: Continue Lasix, strive for negative fluid balance.  #4 shingles of the T9 dermatome: They are no longer  painful, they are pretty much scabbed over at this time. He does not require airborne isolation. Continue Valtrex as prescribed by dermatology.   LOS: 1 day   Jason Ritter,Jason Ritter Formisano 09/04/2011, 3:45 PM

## 2011-09-04 NOTE — H&P (Addendum)
Jason Ritter is an 77 y.o. male.   Chief Complaint: Falls HPI: 74 yar old male with history of diastolic heart failure, afib, anti thrombin 3 deficiency and history of pulmonary embolism on coumadin and INR is subtherapeutic presents with  Having had atleast three falls in the last 24 hours. He states he had his first fall at 1 am yesterday when he was wlaking his legs gave way suddenly. After which he ws not able to get up. EMS was called and patient was placed on his bed and refused to come to ER. Same thing happened again at 8am. EMS was called he refused to come to ER again. Later he did drive himself to his cardiologists office around 11am. Later in the evening at home while walking he had a fall similarly to first two time but this time in addition he had bowel incontinence. This time he agreed to come to ER. In ER he had CT head which is negative and chest xray shows large left pleural effusion. Neurologist on callwas called by EDP. At this time hospitalist was requested to admit and neurologist will be seeing patient in consult.  Of not patient has been diagnosed with shingles five days ago and is on valcyclovir.  Past Medical History  Diagnosis Date  . CHF (congestive heart failure)   . Afib   . Shingles   . Pulmonary embolism   . Hyperlipidemia   . Glaucoma     Past Surgical History  Procedure Date  . Tonsillectomy     Family History  Problem Relation Age of Onset  . Stroke Mother   . Heart failure Father   . Cancer Sister    Social History:  reports that he has never smoked. He does not have any smokeless tobacco history on file. He reports that he drinks alcohol. He reports that he does not use illicit drugs.  Allergies: No Known Allergies  No current facility-administered medications on file as of 09/03/2011.   No current outpatient prescriptions on file as of 09/03/2011.    Results for orders placed during the hospital encounter of 09/03/11 (from the past 48 hour(s))   CBC     Status: Abnormal   Collection Time   09/03/11 10:05 PM      Component Value Range Comment   WBC 6.0  4.0 - 10.5 (K/uL)    RBC 4.15 (*) 4.22 - 5.81 (MIL/uL)    Hemoglobin 13.6  13.0 - 17.0 (g/dL)    HCT 16.1  09.6 - 04.5 (%)    MCV 99.8  78.0 - 100.0 (fL)    MCH 32.8  26.0 - 34.0 (pg)    MCHC 32.9  30.0 - 36.0 (g/dL)    RDW 40.9  81.1 - 91.4 (%)    Platelets 136 (*) 150 - 400 (K/uL)   DIFFERENTIAL     Status: Abnormal   Collection Time   09/03/11 10:05 PM      Component Value Range Comment   Neutrophils Relative 57  43 - 77 (%)    Neutro Abs 3.4  1.7 - 7.7 (K/uL)    Lymphocytes Relative 26  12 - 46 (%)    Lymphs Abs 1.5  0.7 - 4.0 (K/uL)    Monocytes Relative 17 (*) 3 - 12 (%)    Monocytes Absolute 1.0  0.1 - 1.0 (K/uL)    Eosinophils Relative 1  0 - 5 (%)    Eosinophils Absolute 0.0  0.0 - 0.7 (K/uL)  Basophils Relative 0  0 - 1 (%)    Basophils Absolute 0.0  0.0 - 0.1 (K/uL)   BASIC METABOLIC PANEL     Status: Abnormal   Collection Time   09/03/11 10:05 PM      Component Value Range Comment   Sodium 134 (*) 135 - 145 (mEq/L)    Potassium 3.8  3.5 - 5.1 (mEq/L)    Chloride 96  96 - 112 (mEq/L)    CO2 27  19 - 32 (mEq/L)    Glucose, Bld 120 (*) 70 - 99 (mg/dL)    BUN 18  6 - 23 (mg/dL)    Creatinine, Ser 8.29  0.50 - 1.35 (mg/dL)    Calcium 9.2  8.4 - 10.5 (mg/dL)    GFR calc non Af Amer 59 (*) >90 (mL/min)    GFR calc Af Amer 69 (*) >90 (mL/min)   PRO B NATRIURETIC PEPTIDE     Status: Abnormal   Collection Time   09/03/11 10:05 PM      Component Value Range Comment   BNP, POC 1988.0 (*) 0 - 450 (pg/mL)   PROTIME-INR     Status: Abnormal   Collection Time   09/03/11 10:05 PM      Component Value Range Comment   Prothrombin Time 17.9 (*) 11.6 - 15.2 (seconds)    INR 1.45  0.00 - 1.49    URINALYSIS, ROUTINE W REFLEX MICROSCOPIC     Status: Normal   Collection Time   09/03/11 10:26 PM      Component Value Range Comment   Color, Urine YELLOW  YELLOW      Appearance CLEAR  CLEAR     Specific Gravity, Urine 1.012  1.005 - 1.030     pH 6.5  5.0 - 8.0     Glucose, UA NEGATIVE  NEGATIVE (mg/dL)    Hgb urine dipstick NEGATIVE  NEGATIVE     Bilirubin Urine NEGATIVE  NEGATIVE     Ketones, ur NEGATIVE  NEGATIVE (mg/dL)    Protein, ur NEGATIVE  NEGATIVE (mg/dL)    Urobilinogen, UA 0.2  0.0 - 1.0 (mg/dL)    Nitrite NEGATIVE  NEGATIVE     Leukocytes, UA NEGATIVE  NEGATIVE  MICROSCOPIC NOT DONE ON URINES WITH NEGATIVE PROTEIN, BLOOD, LEUKOCYTES, NITRITE, OR GLUCOSE <1000 mg/dL.  POCT I-STAT TROPONIN I     Status: Normal   Collection Time   09/03/11 10:35 PM      Component Value Range Comment   Troponin i, poc 0.01  0.00 - 0.08 (ng/mL)    Comment 3             Dg Chest 2 View  09/03/2011  *RADIOLOGY REPORT*  Clinical Data: Active shingles.  Generalized weakness.  Atrial fibrillation.  History of CHF.  CHEST - 2 VIEW 09/03/2011:  Comparison: Two-view chest x-ray 02/24/2011, 02/20/2011, and portable chest x-ray 02/02/2011 [facility].  Findings: Residual/recurrent large left pleural effusion and associated dense consolidation in the left lower lobe.  Calcified granulomata in the right lower lobe.  Emphysematous changes throughout both lungs.  Lungs otherwise clear.  Pulmonary vascularity normal.  Cardiac silhouette enlarged but stable. Thoracic aorta mildly tortuous and atherosclerotic, unchanged. Hilar and mediastinal contours otherwise unremarkable. Degenerative changes throughout the thoracic spine.  IMPRESSION: Residual/recurrent large left pleural effusion and associated dense passive atelectasis and/or pneumonia in the left lower lobe. Stable cardiomegaly without pulmonary edema.  COPD/emphysema.  Old granulomatous disease.  Original Report Authenticated By: Marya Landry  Lyman Bishop, M.D.   Ct Head Wo Contrast  09/03/2011  *RADIOLOGY REPORT*  Clinical Data: Generalized weakness.  Multiple recent falls. Shingles.  CT HEAD WITHOUT CONTRAST 09/03/2011:   Technique:  Contiguous axial images were obtained from the base of the skull through the vertex without contrast.  Comparison: None.  Findings: Motion degraded several of the images initially but these were repeated and a diagnostic study was obtained.  Moderate to severe cortical atrophy, severe deep atrophy, and moderate cerebellar atrophy.  Mild changes of small vessel disease of the white matter diffusely. No mass lesion.  No midline shift. No acute hemorrhage or hematoma.  No extra-axial fluid collections. No evidence of acute infarction.  No focal osseous abnormality involving the skull.  Visualized paranasal sinuses, mastoid air cells, and middle ear cavities well- aerated.  Bilateral carotid siphon atherosclerosis.  IMPRESSION:  1.  No acute intracranial abnormality. 2.  Moderate to severe generalized atrophy and mild chronic microvascular ischemic changes of the white matter.  Original Report Authenticated By: Arnell Sieving, M.D.    Review of Systems  HENT: Negative.   Eyes: Negative.   Respiratory: Negative.   Cardiovascular: Negative.   Gastrointestinal: Negative.   Genitourinary:       Difficulty urinating   Musculoskeletal: Positive for falls.  Skin: Negative.   Neurological: Positive for weakness.       Lower extremity weakness on walking  Endo/Heme/Allergies: Negative.   Psychiatric/Behavioral: Negative.     Blood pressure 129/77, pulse 95, temperature 99 F (37.2 C), temperature source Oral, resp. rate 15, SpO2 94.00%. Physical Exam  Constitutional: He is oriented to person, place, and time. He appears well-developed and well-nourished.  HENT:  Head: Normocephalic and atraumatic.  Right Ear: External ear normal.  Left Ear: External ear normal.  Nose: Nose normal.  Mouth/Throat: Oropharynx is clear and moist.  Eyes: Conjunctivae are normal. Pupils are equal, round, and reactive to light.  Neck: Normal range of motion. Neck supple.  Cardiovascular: Normal rate,  regular rhythm, normal heart sounds and intact distal pulses.   Respiratory: Effort normal and breath sounds normal.  GI: Soft. Bowel sounds are normal.  Musculoskeletal: He exhibits edema. He exhibits no tenderness.  Neurological: He is alert and oriented to person, place, and time.       Is able to move upper and lower extrmities 5/5 Is able to stand Has no reflexes Sensation is present.  Skin: There is erythema.       Has rash on the right flank area from recent shingles  Psychiatric: His behavior is normal.     Assessment/Plan 1)Lower extremity weakness 2)Large left pleural effusion 3)H/O Afib and Pulmonary embolism and anti thrombin 3 deficiency on coumadin 4)On coumadin with sub therapeutic INR 5)H/O Diastolic CHF 6)Recently diagnosed with shingles on valcyclovir. 7)Chronic alcoholism  Plan: I did discuss with on call neurologist Dr Pearlean Brownie. For now we are ordering MRI Lumbar and thoracic spine with and  without contrast to rule out cord compression or myelitis Will hold off coumadin until then, and there is also a possibility patient may need lumbar puncture or thoracentesis. Patient has large pleural effusion which may be from his CHF. The chest xray is read as possibility of pneumonia also. For now I am placing on avelox and lasix as iv. Will get ct chest to further study his pleural effusion particularly to make sure there is no loculations. Will continue his valcyclovir for the shingles. Will be on contact and airborne precautions for the  same. Neurologist to see in AM. Alcohol withrawal protocol.  Breyer Tejera N. 09/04/2011, 1:52 AM

## 2011-09-04 NOTE — Progress Notes (Signed)
Lab called with results of MRSA PCR. PCR was positive. Contact precautions instituded per protocol. Peter Congo RN

## 2011-09-04 NOTE — Consult Note (Signed)
[facility] Consult Note  Date: 09/04/2011  Patient name: Jason Ritter Medical record number: [id number] Date of birth: March 28, 1929 Age: 77 y.o. Gender: male    Reason For Consult:  LE weakness   Chief Complaint: Recurrent falls History of Present Illness: Jason Ritter is an 77 y.o. male  Admitted to the hospital due to recurrent falls.  Describes sudden onset weakness bilateral LE's with inability to hold his own weight causing him to fall or be unable to get up from standing position.  No associated pain, numbness or tingling.  Does stand with wide base, as he feels he will topple over with feet closer together.   No back pain.  No precipitating sx of dizziness, vertigo, palpitations or near syncope.  Had 1 episode of bowel incontinence with a fall.  Has had greater than 1 year progressive difficulty starting urine stream and emptying bladder.  Has some dysuria.   Has not seen a urologist.    1 year ago was able to walk 1 1/2 miles without difficulty.  Due to CHF and LE edema, can now walk only short distances.  Denies claudication sx.  Does not use a cane or walker.  Still drives.  No accidents.  Developed shingles (R T9-10 Dermatome ) and began valcyclovir 5 days ago.  Was on Neurontin x 3 days-  Stopped Tuesday due to possibly aggravating falls.  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  Neurology consult called to eval LE weakness.   Meds Inpatient  Scheduled:   . folic acid  1 mg Oral Daily  . furosemide  40 mg Intravenous Q12H  . latanoprost  1 drop Left Eye QHS  . levothyroxine  112 mcg Oral Daily  . lisinopril  5 mg Oral Daily  . LORazepam  0-4 mg Oral Q6H   Followed by  . LORazepam  0-4 mg Oral Q12H  . metoprolol  100 mg Oral Daily  . moxifloxacin  400 mg Intravenous Q24H  . multivitamins ther. w/minerals  1 tablet Oral Daily  . simvastatin  20 mg Oral q1800  . sodium chloride  3 mL Intravenous Q12H  . thiamine  100 mg Oral Daily   Or  . thiamine   100 mg Intravenous Daily  . valACYclovir  1,000 mg Oral TID    Prior to Admission medications   Medication Sig Start Date End Date Taking? Authorizing Provider  atorvastatin (LIPITOR) 10 MG tablet Take 10 mg by mouth daily.     Yes Historical Provider, MD  furosemide (LASIX) 40 MG tablet Take 40 mg by mouth 2 (two) times daily.     Yes Historical Provider, MD  latanoprost (XALATAN) 0.005 % ophthalmic solution Place 1 drop into the left eye at bedtime.     Yes Historical Provider, MD  levothyroxine (SYNTHROID, LEVOTHROID) 112 MCG tablet Take 112 mcg by mouth daily.     Yes Historical Provider, MD  lisinopril (PRINIVIL,ZESTRIL) 5 MG tablet Take 5 mg by mouth daily.     Yes Historical Provider, MD  metoprolol (TOPROL-XL) 100 MG 24 hr tablet Take 100 mg by mouth every morning.     Yes Historical Provider, MD  temazepam (RESTORIL) 30 MG capsule Take 30 mg by mouth at bedtime as needed. For sleep.    Yes Historical Provider, MD  valACYclovir (VALTREX) 500 MG tablet Take 1,000 mg by mouth 3 (three) times daily. For 7 days. Has one dose left today, and then will have 2 days left. Should end  on 09/05/11    Yes Historical Provider, MD  warfarin (COUMADIN) 4 MG tablet Take 4 mg by mouth daily.     Yes Historical Provider, MD     Allergies: Review of patient's allergies indicates no known allergies.  Past Medical History  Diagnosis Date  . CHF (congestive heart failure)   . Afib   . Shingles   . Pulmonary embolism   . Hyperlipidemia   . Glaucoma   . Hypothyroidism   . Hypertension    Past Surgical History  Procedure Date  . Tonsillectomy    Family History  Problem Relation Age of Onset  . Stroke Mother   . Heart failure Father   . Cancer Sister    Social History: Married.  Lives with wife in multi-level home.  No children.  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  Dissertation on [place].   Reports that he has never smoked. He does not have any smokeless tobacco history on  file. He reports that he drinks about 4.2 ounces of alcohol per week.  He reports that he does not use illicit drugs.  Review of Systems: See HPI/  No CP or SOB.  Recent 65 lb fluid wt loss due to diuresis from CHF, per pt.     Examination:  Blood pressure 157/84, pulse 82, temperature 97.9 F (36.6 C), temperature source Oral, resp. rate 20, height 6' (1.829 m), weight 95.5 kg (210 lb 8.6 oz), SpO2 96.00%. In general, pleasant and conversant.  Cardiovascular: 2+ pitting edema ankles down bilaterally.  Unable to palpate distal pulses due to edema.   Skin: dry red papular rash R T 9-10 dermatome.  Atrophic skin changes LE's with hypertrophic toe nails.  Mental status:   The patient is oriented to person, place and time. Recent and remote memory are intact. Attention span and concentration are normal. Language including repetition, naming, following commands are intact. Fund of knowledge of current and historical events, as well as vocabulary are normal. No aphasia or dysarthria.  Cranial Nerves: Pupils are equally round and reactive to light. Visual fields full to confrontation. Extraocular movements are intact without nystagmus. Facial sensation and muscles of mastication are intact. Muscles of facial expression are symmetric.  Tongue protrusion midline.  Shoulder shrug intact.  Motor:  5/5 upper and LE strength bilaterally.  The patient has normal bulk and tone of all extremities, no pronator drift.  Tremor of upper extremities, present at rest and with activity.  Reflexes: Biceps  Triceps Brachioradialis Knee Ankle  Right            2+  2+  2+   0          0  Left             2+  2+  2+   0 0  Plantars: mute. (toes upgoing at rest bilaterally)  Coordination:  Normal finger to nose/ heel to shin.  No dysdiadokinesia. Rapid alternating movements intact.  Sensation: Diminished vibratory sense below ankles and finger tips bilaterally.  Positional sense, discrimination intact. No gross  sensory level.  Gait:  Wide based unsteady gait, + Rhomberg.  Labs: CBC    Component Value Date/Time   WBC 5.2 09/04/2011 0600   RBC 4.12* 09/04/2011 0600   HGB 13.6 09/04/2011 0600   HCT 41.1 09/04/2011 0600   PLT 123* 09/04/2011 0600   MCV 99.8 09/04/2011 0600   MCH 33.0 09/04/2011 0600   MCHC 33.1 09/04/2011 0600   RDW  14.0 09/04/2011 0600   LYMPHSABS 1.5 09/03/2011 2205   MONOABS 1.0 09/03/2011 2205   EOSABS 0.0 09/03/2011 2205   BASOSABS 0.0 09/03/2011 2205   Chemistry    Component Value Date/Time   NA 136 09/04/2011 0600   K 3.9 09/04/2011 0600   CL 99 09/04/2011 0600   CO2 27 09/04/2011 0600   GLUCOSE 115* 09/04/2011 0600   BUN 16 09/04/2011 0600   CREATININE 0.93 09/04/2011 0600   CALCIUM 8.9 09/04/2011 0600   MG 2.1 09/04/2011 0600   AST 78* 09/04/2011 0600   ALT 24 09/04/2011 0600   ALKPHOS 121* 09/04/2011 0600   BILITOT 0.8 09/04/2011 0600   PROT 6.7 09/04/2011 0600   ALBUMIN 3.3* 09/04/2011 0600   Lipid Panel    Component Value Date/Time   CHOL  Value: 116        ATP III CLASSIFICATION:  <200     mg/dL   Desirable  161-096  mg/dL   Borderline High  >=045    mg/dL   High        01/19/8118 0208   TRIG 42 02/02/2011 0208   HDL 75 02/02/2011 0208   CHOLHDL 1.5 02/02/2011 0208   VLDL 8 02/02/2011 0208   LDLCALC  Value: 33        Total Cholesterol/HDL:CHD Risk Coronary Heart Disease Risk Table                     Men   Women  1/2 Average Risk   3.4   3.3  Average Risk       5.0   4.4  2 X Average Risk   9.6   7.1  3 X Average Risk  23.4   11.0        Use the calculated Patient Ratio above and the CHD Risk Table to determine the patient's CHD Risk.        ATP III CLASSIFICATION (LDL):  <100     mg/dL   Optimal  147-829  mg/dL   Near or Above                    Optimal  130-159  mg/dL   Borderline  562-130  mg/dL   High  >865     mg/dL   Very High 7/84/6962 9528   A1C No results found for this basename: HGBA1C   Cardiac Enzymes    Component Value Date/Time    CKTOTAL 71 02/02/2011 0208   CKMB 2.0 02/02/2011 0208   TROPONINI  Value: 0.02        NO INDICATION OF MYOCARDIAL INJURY. 02/02/2011 0208   Thyroid    Component Value Date/Time   TSH 0.856 02/02/2011 0208   Anemia No results found for this basename: VitaminB12, Folate, Ferritin, TIBC, Iron, reticctpct   Micro No results found for this or any previous visit (from the past 240 hour(s)).  Imaging: Dg Chest 2 View  09/03/2011  *RADIOLOGY REPORT*  Clinical Data: Active shingles.  Generalized weakness.  Atrial fibrillation.  History of CHF.  CHEST - 2 VIEW 09/03/2011:  Comparison: Two-view chest x-ray 02/24/2011, 02/20/2011, and portable chest x-ray 02/02/2011 [facility].  Findings: Residual/recurrent large left pleural effusion and associated dense consolidation in the left lower lobe.  Calcified granulomata in the right lower lobe.  Emphysematous changes throughout both lungs.  Lungs otherwise clear.  Pulmonary vascularity normal.  Cardiac silhouette enlarged but stable. Thoracic aorta mildly tortuous and atherosclerotic, unchanged.  Hilar and mediastinal contours otherwise unremarkable. Degenerative changes throughout the thoracic spine.  IMPRESSION: Residual/recurrent large left pleural effusion and associated dense passive atelectasis and/or pneumonia in the left lower lobe. Stable cardiomegaly without pulmonary edema.  COPD/emphysema.  Old granulomatous disease.  Original Report Authenticated By: Arnell Sieving, M.D.   Ct Head Wo Contrast  09/03/2011  *RADIOLOGY REPORT*  Clinical Data: Generalized weakness.  Multiple recent falls. Shingles.  CT HEAD WITHOUT CONTRAST 09/03/2011:  Technique:  Contiguous axial images were obtained from the base of the skull through the vertex without contrast.  Comparison: None.  Findings: Motion degraded several of the images initially but these were repeated and a diagnostic study was obtained.  Moderate to severe cortical atrophy, severe deep atrophy,  and moderate cerebellar atrophy.  Mild changes of small vessel disease of the white matter diffusely. No mass lesion.  No midline shift. No acute hemorrhage or hematoma.  No extra-axial fluid collections. No evidence of acute infarction.  No focal osseous abnormality involving the skull.  Visualized paranasal sinuses, mastoid air cells, and middle ear cavities well- aerated.  Bilateral carotid siphon atherosclerosis.  IMPRESSION:  1.  No acute intracranial abnormality. 2.  Moderate to severe generalized atrophy and mild chronic microvascular ischemic changes of the white matter.  Original Report Authenticated By: Arnell Sieving, M.D.    Assessment Mr. Jason Ritter is a 77 y.o. male with acute onset inability to maintain weight against gravity, resulting in fall x 3.  May have had a similar episode 3-4 months ago.  Neuro exam most consistent with sensory neuropathy/ ataxia.  Cannot exclude orthostatic tremor/ hypotension or exacerbation of sx due to gabapentin use.   Exam not consistent with Guillian Barre syndrome or cord compression, making these differentials less likely.  Pt denies history of diabetes.  Recommendations:  1. MRI w/wo contrast T&L spine  2. If MRI unremarkable will recommend LP  3.B12, Folate, ESR, RPR, SPEP 4.orthostatic BP/HR 5. May need outpatient EMG if w/u inconclusive.  Thank you for having [place] see Jason Ritter in consultation.  Will follow with you.  Please call with any questions.   LOS: 1 day   Jana Hakim [facility] [phone] 09/04/2011  8:17 AM

## 2011-09-05 ENCOUNTER — Inpatient Hospital Stay (HOSPITAL_COMMUNITY): Payer: Medicare Other

## 2011-09-05 MED ORDER — GADOBENATE DIMEGLUMINE 529 MG/ML IV SOLN
20.0000 mL | Freq: Once | INTRAVENOUS | Status: AC
  Administered 2011-09-05: 20 mL via INTRAVENOUS

## 2011-09-05 NOTE — Progress Notes (Addendum)
Subjective: Continues to have weakness bilateral LE and decreased sensation in feet to mid calf  Objective: Vital signs in last 24 hours: Temp:  [97.5 F (36.4 C)-98.2 F (36.8 C)] 98.2 F (36.8 C) (11/23 0532) Pulse Rate:  [70-96] 80  (11/23 1132) Resp:  [20-21] 21  (11/23 0532) BP: (122-156)/(74-96) 122/76 mmHg (11/23 1132) SpO2:  [96 %-97 %] 97 % (11/23 0532) Weight:  [95.89 kg (211 lb 6.4 oz)] 211 lb 6.4 oz (95.89 kg) (11/23 0532)  Intake/Output from previous day: 11/22 0701 - 11/23 0700 In: 822 [P.O.:822] Out: 1725 [Urine:1725] Intake/Output this shift: Total I/O In: 240 [P.O.:240] Out: 500 [Urine:500] Nutritional status: Cardiac  Past Medical History  Diagnosis Date  . CHF (congestive heart failure)   . Afib   . Shingles   . Pulmonary embolism   . Hyperlipidemia   . Glaucoma   . Hypothyroidism   . Hypertension     Neurologic Exam:  Mental Status: Alert, oriented, thought content appropriate.  Speech fluent without evidence of aphasia. Able to follow 3 step commands without difficulty. Cranial Nerves: II-Visual fields grossly intact. III/IV/VI-Extraocular movements intact.  Pupils reactive bilaterally. V/VII-Smile symmetric VIII-grossly intact IX/X-normal gag XI-bilateral shoulder shrug XII-midline tongue extension Motor: 5/5 bilaterally with normal tone and bulk Sensory: Pinprick and light touch intact throughout, bilaterally--decreased sensation in feet up to mid calf bilaterally Deep Tendon Reflexes: 2+ and symmetric throughout upper extremities.  No reflexes appreciated in bilateral patella and achilles.   Plantars: mute bilaterally Cerebellar: Normal finger-to-nose, normal rapid alternating movements and normal heel-to-shin test.  Wide based gait and station.      Lab Results:  Basename 09/04/11 0600 09/03/11 2205  WBC 5.2 6.0  HGB 13.6 13.6  HCT 41.1 41.4  PLT 123* 136*  NA 136 134*  K 3.9 3.8  CL 99 96  CO2 27 27  GLUCOSE 115* 120*  BUN  16 18  CREATININE 0.93 1.13  CALCIUM 8.9 9.2  LABA1C -- --   Lipid Panel No results found for this basename: CHOL,TRIG,HDL,CHOLHDL,VLDL,LDLCALC in the last 72 hours  Studies/Results: Dg Chest 2 View  09/03/2011  *RADIOLOGY REPORT*  Clinical Data: Active shingles.  Generalized weakness.  Atrial fibrillation.  History of CHF.  CHEST - 2 VIEW 09/03/2011:  Comparison: Two-view chest x-ray 02/24/2011, 02/20/2011, and portable chest x-ray 02/02/2011 [facility].  Findings: Residual/recurrent large left pleural effusion and associated dense consolidation in the left lower lobe.  Calcified granulomata in the right lower lobe.  Emphysematous changes throughout both lungs.  Lungs otherwise clear.  Pulmonary vascularity normal.  Cardiac silhouette enlarged but stable. Thoracic aorta mildly tortuous and atherosclerotic, unchanged. Hilar and mediastinal contours otherwise unremarkable. Degenerative changes throughout the thoracic spine.  IMPRESSION: Residual/recurrent large left pleural effusion and associated dense passive atelectasis and/or pneumonia in the left lower lobe. Stable cardiomegaly without pulmonary edema.  COPD/emphysema.  Old granulomatous disease.  Original Report Authenticated By: Arnell Sieving, M.D.   Ct Head Wo Contrast  09/03/2011  *RADIOLOGY REPORT*  Clinical Data: Generalized weakness.  Multiple recent falls. Shingles.  CT HEAD WITHOUT CONTRAST 09/03/2011:  Technique:  Contiguous axial images were obtained from the base of the skull through the vertex without contrast.  Comparison: None.  Findings: Motion degraded several of the images initially but these were repeated and a diagnostic study was obtained.  Moderate to severe cortical atrophy, severe deep atrophy, and moderate cerebellar atrophy.  Mild changes of small vessel disease of the white matter diffusely. No mass  lesion.  No midline shift. No acute hemorrhage or hematoma.  No extra-axial fluid collections. No evidence of  acute infarction.  No focal osseous abnormality involving the skull.  Visualized paranasal sinuses, mastoid air cells, and middle ear cavities well- aerated.  Bilateral carotid siphon atherosclerosis.  IMPRESSION:  1.  No acute intracranial abnormality. 2.  Moderate to severe generalized atrophy and mild chronic microvascular ischemic changes of the white matter.  Original Report Authenticated By: Arnell Sieving, M.D.   Ct Chest Wo Contrast  09/04/2011  *RADIOLOGY REPORT*  Clinical Data: Shortness of breath.  Left pleural effusion.  CT CHEST WITHOUT CONTRAST  Technique:  Multidetector CT imaging of the chest was performed following the standard protocol without IV contrast.  Comparison: None.  Findings: A moderate-to-large left pleural effusion is seen with compressive atelectasis of the left lower lobe.  A tiny right pleural effusion is seen.  There is no evidence of pulmonary consolidation or mass.  No evidence of centrally obstructing mass or lymphadenopathy on this noncontrast study.  Shotty mediastinal lymph nodes are seen, none of which are pathologically enlarged. No adenopathy seen elsewhere within the thorax.  Mild cardiomegaly noted with mild coronary artery calcification. No evidence of pericardial effusion.  Right lower lobe calcified granuloma and right hilar lymph node calcifications, consistent with old granulomatous disease.  Several noncalcified pulmonary nodules are seen in the right lung, largest in the right lower lobe measuring 7 mm on image 47.  IMPRESSION:  1.  Moderate-to-large left pleural effusion and left lower lobe compressive atelectasis. 2.  No evidence of centrally obstructing mass or lymphadenopathy. 3.  Several noncalcified indeterminate right lung nodules, largest measuring 7 mm. If the patient is at high risk for bronchogenic carcinoma, follow-up chest CT at 3-6 months is recommended.  If the patient is at low risk for bronchogenic carcinoma, follow-up chest CT at 6-12  months is recommended.  This recommendation follows the consensus statement: Guidelines for Management of Small Pulmonary Nodules Detected on CT Scans: A Statement from the Fleischner Society as published in Radiology 2005; 237:395-400. Online at: DietDisorder.cz.  Original Report Authenticated By: Danae Orleans, M.D.   Mr Lumbar Spine W Wo Contrast  09/05/2011  *RADIOLOGY REPORT*  Clinical Data: Cord compression.  Back pain and bowel incontinence.  MRI LUMBAR SPINE WITHOUT AND WITH CONTRAST  Technique:  Multiplanar and multiecho pulse sequences of the lumbar spine were obtained without and with intravenous contrast.  Contrast: 20mL MULTIHANCE GADOBENATE DIMEGLUMINE 529 MG/ML IV SOLN  Comparison: None.  Findings: The urinary bladder is markedly distended.  The patient may benefit from Foley catheter placement.  There is a dextroconvex lumbar scoliosis with the apex at L2.  Partially visualized cystic lesion is present in the inferior left kidney.  The spinal cord terminates posterior to the T12-L1 interspace.  Multilevel lumbar spondylosis is present. There is heterogenous marrow with loss of normal fatty marrow signal, which is a nonspecific finding. It is commonly associated with anemia, chronic disease, obesity, cigarette smoking.  Whole body height is preserved.  There are no compression fractures.  No compression of the cauda equina  T12-L1:  Disc desiccation and degeneration without stenosis.  L1-L2:  Shallow left eccentric disc bulging without stenosis.  Left lateral osteophytes are present.  Probable left lateral disc protrusion extending into the medial left psoas muscle.  L2-L3:  Disc desiccation with broad-based posterior disc bulge. Mild central stenosis associated with bulging disc and facet hypertrophy as well as ligamentum flavum redundancy.  Crowding of the left lateral recess without neural compression.  Mild left foraminal stenosis.  Right foramen patent.   L3-L4:  Disc desiccation with circumferential disc bulge.  Facet hypertrophy.  Mild central stenosis.  Foramina patent.  L4-L5:  There is high signal within the disc.  There appears to be ankylosis across the disc space on the right.  High signal in the this likely represents ossification.  No enhancement to suggest infection.  The central canal is patent.  The foramina appear adequately patent.  There is an osteophyte in the left lateral region contacting the exiting left L4 nerve.  L5-S1:  Severe disc desiccation and degeneration.  The foramina are patent.  Large osteophytes are present anteriorly and to the right in the lateral region potentially irritating the right L5 nerve.   After Gadolinium administration, there is no pathologic enhancement.    IMPRESSION: 1.  Marked distention of the urinary bladder.  No cauda equina compression. 2.  Multilevel lumbar spondylosis as described above.  Original Report Authenticated By: Andreas Newport, M.D.    Medications:  Scheduled:   . folic acid  1 mg Oral Daily  . furosemide  40 mg Intravenous Q12H  . gadobenate dimeglumine  20 mL Intravenous Once  . latanoprost  1 drop Left Eye QHS  . levothyroxine  112 mcg Oral Daily  . lisinopril  5 mg Oral Daily  . LORazepam  0-4 mg Oral Q6H   Followed by  . LORazepam  0-4 mg Oral Q12H  . metoprolol  100 mg Oral Daily  . multivitamins ther. w/minerals  1 tablet Oral Daily  . simvastatin  20 mg Oral q1800  . sodium chloride  3 mL Intravenous Q12H  . thiamine  100 mg Oral Daily   Or  . thiamine  100 mg Intravenous Daily  . valACYclovir  1,000 mg Oral TID  . DISCONTD: moxifloxacin  400 mg Intravenous Q24H    Assessment/Plan:  Jason Ritter is a 77 y.o. male with acute onset inability to maintain weight against gravity, resulting in fall x 3. May have had a similar episode 3-4 months ago. Neuro exam most consistent with sensory neuropathy/ ataxia. Cannot exclude orthostatic tremor/ hypotension or  exacerbation of sx due to gabapentin use.   TSH 1.085 RPR (-) MRI L and T spine shows no cord abnormality  After further discussion with patient and Dr. Lyman Speller it is felt that his symptom history is not indicative of GBS and more of a chronic sensory ataxia. The episode of leg heaviness are discrete periods in time when he felt that legs were heavy with resolution in between. Currently, he says that he is back to baseline. We do not feel that LP at this time is warranted. However, he has been documented to have orthostasis and I suspect his symptoms of perceived leg heaviness may be related to that. This matter should be addressed. Patient should also follow up with outpatient neurology to be evaluated for EMG/NCS and to have follow up. Call with questions.   Felicie Morn PA-C [facility] [phone]  09/05/2011, 11:35 AM

## 2011-09-05 NOTE — Discharge Summary (Signed)
Physician Discharge Summary  Patient ID: Jason Ritter MRN: [id number] DOB/AGE: [birth year] 77 y.o.  Admit date: 09/03/2011 Discharge date: 09/05/2011  Primary Care Physician:  No primary provider on file.   Discharge Diagnoses:    Principal Problem:  *Lower extremity weakness Active Problems:  Pleural effusion on left  CHF (congestive heart failure)  A-fib Chronic ETOH use   Current Discharge Medication List    CONTINUE these medications which have NOT CHANGED   Details  atorvastatin (LIPITOR) 10 MG tablet Take 10 mg by mouth daily.      furosemide (LASIX) 40 MG tablet Take 40 mg by mouth 2 (two) times daily.      latanoprost (XALATAN) 0.005 % ophthalmic solution Place 1 drop into the left eye at bedtime.      levothyroxine (SYNTHROID, LEVOTHROID) 112 MCG tablet Take 112 mcg by mouth daily.      lisinopril (PRINIVIL,ZESTRIL) 5 MG tablet Take 5 mg by mouth daily.      metoprolol (TOPROL-XL) 100 MG 24 hr tablet Take 100 mg by mouth every morning.      temazepam (RESTORIL) 30 MG capsule Take 30 mg by mouth at bedtime as needed. For sleep.     warfarin (COUMADIN) 4 MG tablet Take 4 mg by mouth as directed. Patient takes 4 mg daily Tuesday through Sunday and 2 mg on Monday. Last INR in clinic 2.4.       STOP taking these medications     valACYclovir (VALTREX) 500 MG tablet          Disposition and Follow-up:  Patient to be discharged home today in stable and improved condition. Followup with his primary care physician and with his cardiologist, Dr. Velna Ochs, as scheduled.   Consults:  neurology Dr. Lyman Speller   Significant Diagnostic Studies:  Dg Chest 2 View  09/03/2011  *RADIOLOGY REPORT*  Clinical Data: Active shingles.  Generalized weakness.  Atrial fibrillation.  History of CHF.  CHEST - 2 VIEW 09/03/2011:  Comparison: Two-view chest x-ray 02/24/2011, 02/20/2011, and portable chest x-ray 02/02/2011 [facility].  Findings: Residual/recurrent large left  pleural effusion and associated dense consolidation in the left lower lobe.  Calcified granulomata in the right lower lobe.  Emphysematous changes throughout both lungs.  Lungs otherwise clear.  Pulmonary vascularity normal.  Cardiac silhouette enlarged but stable. Thoracic aorta mildly tortuous and atherosclerotic, unchanged. Hilar and mediastinal contours otherwise unremarkable. Degenerative changes throughout the thoracic spine.  IMPRESSION: Residual/recurrent large left pleural effusion and associated dense passive atelectasis and/or pneumonia in the left lower lobe. Stable cardiomegaly without pulmonary edema.  COPD/emphysema.  Old granulomatous disease.  Original Report Authenticated By: Arnell Sieving, M.D.   Ct Head Wo Contrast  09/03/2011  *RADIOLOGY REPORT*  Clinical Data: Generalized weakness.  Multiple recent falls. Shingles.  CT HEAD WITHOUT CONTRAST 09/03/2011:  Technique:  Contiguous axial images were obtained from the base of the skull through the vertex without contrast.  Comparison: None.  Findings: Motion degraded several of the images initially but these were repeated and a diagnostic study was obtained.  Moderate to severe cortical atrophy, severe deep atrophy, and moderate cerebellar atrophy.  Mild changes of small vessel disease of the white matter diffusely. No mass lesion.  No midline shift. No acute hemorrhage or hematoma.  No extra-axial fluid collections. No evidence of acute infarction.  No focal osseous abnormality involving the skull.  Visualized paranasal sinuses, mastoid air cells, and middle ear cavities well- aerated.  Bilateral carotid siphon atherosclerosis.  IMPRESSION:  1.  No acute intracranial abnormality. 2.  Moderate to severe generalized atrophy and mild chronic microvascular ischemic changes of the white matter.  Original Report Authenticated By: Arnell Sieving, M.D.   Ct Chest Wo Contrast  09/04/2011  *RADIOLOGY REPORT*  Clinical Data: Shortness of  breath.  Left pleural effusion.  CT CHEST WITHOUT CONTRAST  Technique:  Multidetector CT imaging of the chest was performed following the standard protocol without IV contrast.  Comparison: None.  Findings: A moderate-to-large left pleural effusion is seen with compressive atelectasis of the left lower lobe.  A tiny right pleural effusion is seen.  There is no evidence of pulmonary consolidation or mass.  No evidence of centrally obstructing mass or lymphadenopathy on this noncontrast study.  Shotty mediastinal lymph nodes are seen, none of which are pathologically enlarged. No adenopathy seen elsewhere within the thorax.  Mild cardiomegaly noted with mild coronary artery calcification. No evidence of pericardial effusion.  Right lower lobe calcified granuloma and right hilar lymph node calcifications, consistent with old granulomatous disease.  Several noncalcified pulmonary nodules are seen in the right lung, largest in the right lower lobe measuring 7 mm on image 47.  IMPRESSION:  1.  Moderate-to-large left pleural effusion and left lower lobe compressive atelectasis. 2.  No evidence of centrally obstructing mass or lymphadenopathy. 3.  Several noncalcified indeterminate right lung nodules, largest measuring 7 mm. If the patient is at high risk for bronchogenic carcinoma, follow-up chest CT at 3-6 months is recommended.  If the patient is at low risk for bronchogenic carcinoma, follow-up chest CT at 6-12 months is recommended.  This recommendation follows the consensus statement: Guidelines for Management of Small Pulmonary Nodules Detected on CT Scans: A Statement from the Fleischner Society as published in Radiology 2005; 237:395-400. Online at: DietDisorder.cz.  Original Report Authenticated By: Danae Orleans, M.D.   Mr Thoracic Spine W Wo Contrast  09/05/2011  *RADIOLOGY REPORT*  Clinical Data: Compression fracture.  Fall.  Back pain.  Bowel incontinence.  MRI  THORACIC SPINE WITHOUT AND WITH CONTRAST  Technique:  Multiplanar and multiecho pulse sequences of the thoracic spine were obtained without and with intravenous contrast.  Contrast:  20 ml Multihance.  Comparison: Chest CT 09/04/2011.  Findings: Mildly exaggerated thoracic kyphosis.  2 mm anterolisthesis of T2 on T3.  Thoracic spine DISH is present. Ossification of the T9-T10 disc.  Benign vertebral body hemangiomata are present. There is heterogenous marrow with loss of normal fatty marrow signal, which is a nonspecific finding. It is commonly associated with anemia, chronic disease, obesity, cigarette smoking.  There is no abnormal enhancement after Gadolinium administration. Of the thoracic cord shows normal caliber and intramedullary signal.  The trace anterolisthesis of C7-9 T1 is present with posterior ligamentum flavum redundancy that produces a mild impression on the posterior thecal sac.  Large left pleural effusion is present.  Associated atelectasis.  Upper lumbar scoliosis is partially visualized.  Vertebral body height is preserved.  There is no compression fracture or bone marrow edema.  C7-T1:  Posterior ligamentum flavum redundancy with mild central stenosis.  T1-T2:  Shallow right eccentric broad-based disc osteophyte complex.  Very mild central stenosis.  T2-T3:  Trace anterolisthesis with uncoverage of the disc.  The shallow disc osteophyte complex with mild central stenosis.  T3-T4:  Negative.  T4-T5:  Negative.  T5-T6:  The negative.  T6-T7:  Negative.  T7-T8:  Negative.  T8-T9:  Schmorl's node in the inferior T8 endplate.  No stenosis.  T9-T10:  Ossified disc.  No stenosis.  T10-T11:  Mild right posterior ligamentum flavum redundancy and facet hypertrophy.  No stenosis.  T11-T12:  Mild facet degeneration.  IMPRESSION: No thoracic cord compression.  Thoracic spine DISH.  Upper thoracic spine and lower cervical degenerative disease with mild central stenosis.  Original Report Authenticated By:  Andreas Newport, M.D.   Mr Lumbar Spine W Wo Contrast  09/05/2011  *RADIOLOGY REPORT*  Clinical Data: Cord compression.  Back pain and bowel incontinence.  MRI LUMBAR SPINE WITHOUT AND WITH CONTRAST  Technique:  Multiplanar and multiecho pulse sequences of the lumbar spine were obtained without and with intravenous contrast.  Contrast: 20mL MULTIHANCE GADOBENATE DIMEGLUMINE 529 MG/ML IV SOLN  Comparison: None.  Findings: The urinary bladder is markedly distended.  The patient may benefit from Foley catheter placement.  There is a dextroconvex lumbar scoliosis with the apex at L2.  Partially visualized cystic lesion is present in the inferior left kidney.  The spinal cord terminates posterior to the T12-L1 interspace.  Multilevel lumbar spondylosis is present. There is heterogenous marrow with loss of normal fatty marrow signal, which is a nonspecific finding. It is commonly associated with anemia, chronic disease, obesity, cigarette smoking.  Whole body height is preserved.  There are no compression fractures.  No compression of the cauda equina  T12-L1:  Disc desiccation and degeneration without stenosis.  L1-L2:  Shallow left eccentric disc bulging without stenosis.  Left lateral osteophytes are present.  Probable left lateral disc protrusion extending into the medial left psoas muscle.  L2-L3:  Disc desiccation with broad-based posterior disc bulge. Mild central stenosis associated with bulging disc and facet hypertrophy as well as ligamentum flavum redundancy.  Crowding of the left lateral recess without neural compression.  Mild left foraminal stenosis.  Right foramen patent.  L3-L4:  Disc desiccation with circumferential disc bulge.  Facet hypertrophy.  Mild central stenosis.  Foramina patent.  L4-L5:  There is high signal within the disc.  There appears to be ankylosis across the disc space on the right.  High signal in the this likely represents ossification.  No enhancement to suggest infection.  The  central canal is patent.  The foramina appear adequately patent.  There is an osteophyte in the left lateral region contacting the exiting left L4 nerve.  L5-S1:  Severe disc desiccation and degeneration.  The foramina are patent.  Large osteophytes are present anteriorly and to the right in the lateral region potentially irritating the right L5 nerve.   After Gadolinium administration, there is no pathologic enhancement.  IMPRESSION: 1.  Marked distention of the urinary bladder.  No cauda equina compression. 2.  Multilevel lumbar spondylosis as described above.  Original Report Authenticated By: Andreas Newport, M.D.    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 Because of this her asked to admit her for further evaluation.    Hospital Course:  Principal Problem:  *Lower extremity weakness Active Problems:  Pleural effusion on left  CHF (congestive heart failure)  A-fib  #1 falls/lower extremity weakness: TSH and vitamin B 12 are within normal limits. MRI of the thoracic and lumbar spine only showed evidence for multilevel spondylosis without explanation for his lower  extremity weakness. His exam is most consistent with peripheral neuropathy which I believe is likely secondary to long-standing alcoholism. He is agreeable with home health PT which I will set up prior to discharge.  #2 moderate sized left pleural effusion: He is completely asymptomatic. Denies any chest pain or shortness of breath at rest or with exertion. This is likely a transudative effusion related to his diastolic CHF. I do not believe a thoracentesis is indicated at this time.   #3 T9 dermatome shingles: He has completed a course of  Valtrex as indicated by his term at colleges.  #4 chronic diastolic CHF. Has not been active this admission. He is to continue his home doses of Lasix.  Time spent on Discharge: Greater than 30 minutes.  SignedChaya Jan 09/05/2011, 12:31 PM

## 2011-09-05 NOTE — Progress Notes (Signed)
09/05/2011 Eliz Nigg, Bosie Clos [facility] Case Management Note [phone]       Attempted to go in and speak with patient this afternoon at 1402 about home health services. Patient states,"I am not able to talk to you right now. I have a car waiting on me. I told them that I would have to figure out my schedule next week." Patient standing at the bedside putting a belt on. Attempted to explain need to establish home health prior to discharging, and patient stated, " I just can't have time to talk with you right now. I have the form. I know that [facility] is the first one. I will deal with this next week. I have to go." Wished the patient well. Notified Dr. Ardyth Harps of conversation. No further needs identified. Patient being discharged home.

## 2011-09-05 NOTE — Progress Notes (Signed)
Jason Ritter to be D/C'd Home per MD order.  Discussed prescriptions and follow up appointments with the patient. Prescriptions given to patient, medication list explained in detail. Pt verbalized understanding.   Jason Ritter, Jason Ritter  Home Medication Instructions ZOX:[id number]   Printed on:09/05/11 1321  Medication Information                    metoprolol (TOPROL-XL) 100 MG 24 hr tablet Take 100 mg by mouth every morning.             lisinopril (PRINIVIL,ZESTRIL) 5 MG tablet Take 5 mg by mouth daily.             atorvastatin (LIPITOR) 10 MG tablet Take 10 mg by mouth daily.             temazepam (RESTORIL) 30 MG capsule Take 30 mg by mouth at bedtime as needed. For sleep.            levothyroxine (SYNTHROID, LEVOTHROID) 112 MCG tablet Take 112 mcg by mouth daily.             furosemide (LASIX) 40 MG tablet Take 40 mg by mouth 2 (two) times daily.             latanoprost (XALATAN) 0.005 % ophthalmic solution Place 1 drop into the left eye at bedtime.             warfarin (COUMADIN) 4 MG tablet Take 4 mg by mouth as directed. Patient takes 4 mg daily Tuesday through Sunday and 2 mg on Monday. Last INR in clinic 2.4.              Filed Vitals:   09/05/11 1132  BP: 122/76  Pulse: 80  Temp:   Resp:     Skin clean, dry and intact without evidence of skin break down, no evidence of skin tears noted. IV catheter discontinued intact. Site without signs and symptoms of complications. Dressing and pressure applied. Bilateral lower extremity swelling. Shingles on trunk. Pt denies pain at this time. No complaints noted. Pt wishes to have case management set up via phone after admission. Case manager notified. An After Visit Summary was printed and given to the patient. Patient escorted via WC, and D/C home via private auto.  [place], [employer] 09/05/2011 1:21 PM

## 2011-09-08 LAB — HEAVY METALS SCREEN, URINE: Arsenic, 24H Ur: 9 mcg/L (ref <81)

## 2011-09-10 LAB — IMMUNOFIXATION ELECTROPHORESIS
IgA: 327 mg/dL (ref 68–379)
IgM, Serum: 159 mg/dL (ref 41–251)
Total Protein ELP: 6.7 g/dL (ref 6.0–8.3)

## 2011-11-20 ENCOUNTER — Other Ambulatory Visit: Payer: Self-pay | Admitting: Endocrinology

## 2011-11-20 DIAGNOSIS — R9389 Abnormal findings on diagnostic imaging of other specified body structures: Secondary | ICD-10-CM

## 2011-11-20 DIAGNOSIS — R634 Abnormal weight loss: Secondary | ICD-10-CM

## 2011-12-08 ENCOUNTER — Ambulatory Visit
Admission: RE | Admit: 2011-12-08 | Discharge: 2011-12-08 | Disposition: A | Discharge status: Home / Self Care | Payer: Medicare Other | Source: Ambulatory Visit | Attending: Endocrinology | Admitting: Endocrinology

## 2011-12-08 DIAGNOSIS — R9389 Abnormal findings on diagnostic imaging of other specified body structures: Secondary | ICD-10-CM

## 2011-12-08 DIAGNOSIS — R634 Abnormal weight loss: Secondary | ICD-10-CM

## 2011-12-08 MED ORDER — IOHEXOL 300 MG/ML  SOLN
125.0000 mL | Freq: Once | INTRAMUSCULAR | Status: AC | PRN
  Administered 2011-12-08: 125 mL via INTRAVENOUS

## 2012-10-01 ENCOUNTER — Encounter (HOSPITAL_BASED_OUTPATIENT_CLINIC_OR_DEPARTMENT_OTHER): Payer: Medicare Other

## 2012-10-29 ENCOUNTER — Encounter (HOSPITAL_BASED_OUTPATIENT_CLINIC_OR_DEPARTMENT_OTHER): Payer: Medicare Other

## 2013-07-18 ENCOUNTER — Ambulatory Visit (INDEPENDENT_AMBULATORY_CARE_PROVIDER_SITE_OTHER): Payer: 59 | Admitting: Pharmacist

## 2013-07-18 DIAGNOSIS — I2699 Other pulmonary embolism without acute cor pulmonale: Secondary | ICD-10-CM

## 2013-07-18 DIAGNOSIS — I4891 Unspecified atrial fibrillation: Secondary | ICD-10-CM

## 2013-08-08 ENCOUNTER — Other Ambulatory Visit: Payer: Self-pay | Admitting: Interventional Cardiology

## 2013-09-22 ENCOUNTER — Encounter: Payer: Self-pay | Admitting: Interventional Cardiology

## 2013-09-23 ENCOUNTER — Encounter: Payer: Self-pay | Admitting: Interventional Cardiology

## 2013-09-23 ENCOUNTER — Ambulatory Visit (INDEPENDENT_AMBULATORY_CARE_PROVIDER_SITE_OTHER): Payer: 59 | Admitting: Interventional Cardiology

## 2013-09-23 ENCOUNTER — Ambulatory Visit (INDEPENDENT_AMBULATORY_CARE_PROVIDER_SITE_OTHER): Payer: 59 | Admitting: Pharmacist

## 2013-09-23 VITALS — BP 120/76 | HR 75 | Ht 72.0 in | Wt 207.0 lb

## 2013-09-23 DIAGNOSIS — I4891 Unspecified atrial fibrillation: Secondary | ICD-10-CM

## 2013-09-23 DIAGNOSIS — I10 Essential (primary) hypertension: Secondary | ICD-10-CM | POA: Insufficient documentation

## 2013-09-23 DIAGNOSIS — I5032 Chronic diastolic (congestive) heart failure: Secondary | ICD-10-CM | POA: Insufficient documentation

## 2013-09-23 DIAGNOSIS — E782 Mixed hyperlipidemia: Secondary | ICD-10-CM

## 2013-09-23 DIAGNOSIS — I2699 Other pulmonary embolism without acute cor pulmonale: Secondary | ICD-10-CM

## 2013-09-23 DIAGNOSIS — R609 Edema, unspecified: Secondary | ICD-10-CM | POA: Insufficient documentation

## 2013-09-23 NOTE — Patient Instructions (Signed)
Your physician recommends that you continue on your current medications as directed. Please refer to the Current Medication list given to you today.  Your physician wants you to follow-up in: 6 months with Dr. Varanasi.  You will receive a reminder letter in the mail two months in advance. If you don't receive a letter, please call our office to schedule the follow-up appointment.  

## 2013-09-23 NOTE — Progress Notes (Signed)
Patient ID: Jason Ritter, male   DOB: 01/13/1929, 79 y.o.   MRN: [id number]    [address] [place], [place]  [address] Phone: [phone] Fax:  [phone]  Date:  09/23/2013   ID:  Jason Ritter, DOB 04-19-1929, MRN [id number]  PCP:  Pearla Dubonnet, MD      History of Present Illness: Jason Ritter is a 79 y.o. male with known diastolic dysfunction and atrial fibrillation. His shortness of breath but this improved. He is able to lie flat. he does not report any chest pain. He has lost weight. He went to [place] and went to the [employer] and had some swelling. This is gettting better. He feels that his fluid status is better.  His INR was 4.4 today.  His gait is more steady as well.  He took a trip to [place] as well.   No bleeding problems.       Wt Readings from Last 3 Encounters:  09/23/13 207 lb (93.895 kg)  09/05/11 211 lb 6.4 oz (95.89 kg)     Past Medical History  Diagnosis Date  . CHF (congestive heart failure)   . Afib   . Shingles   . Pulmonary embolism   . Hyperlipidemia   . Glaucoma   . Hypothyroidism   . Hypertension     Current Outpatient Prescriptions  Medication Sig Dispense Refill  . AFLURIA PRESERVATIVE FREE injection       . amLODipine (NORVASC) 2.5 MG tablet Take 2.5 mg by mouth daily.       [name] atorvastatin (LIPITOR) 10 MG tablet Take 10 mg by mouth daily.        . furosemide (LASIX) 40 MG tablet Take 40 mg by mouth 2 (two) times daily.        [name] levothyroxine (SYNTHROID, LEVOTHROID) 112 MCG tablet Take 112 mcg by mouth daily.        [name] lisinopril (PRINIVIL,ZESTRIL) 5 MG tablet Take 5 mg by mouth daily.        [name] LUMIGAN 0.01 % SOLN Place 1 drop into the right eye at bedtime.       . metoprolol succinate (TOPROL-XL) 100 MG 24 hr tablet TAKE 1 TABLET DAILY.  90 tablet  0  . temazepam (RESTORIL) 30 MG capsule Take 30 mg by mouth at bedtime as needed. For sleep.       [name] warfarin (COUMADIN) 4 MG tablet Take 4 mg by mouth as  directed. Patient takes 4 mg daily Tuesday through Sunday and 2 mg on Monday. Last INR in clinic 2.4.        No current facility-administered medications for this visit.    Allergies:   No Known Allergies  Social History:  The patient  reports that he has never smoked. He does not have any smokeless tobacco history on file. He reports that he drinks about 4.2 ounces of alcohol per week. He reports that he does not use illicit drugs.   Family History:  The patient's family history includes Cancer in his sister; Heart failure in his father; Stroke in his mother.   ROS:  Please see the history of present illness.  No nausea, vomiting.  No fevers, chills.  No focal weakness.  No dysuria. Mild edema in bilateral lower extremities.  More energy.   All other systems reviewed and negative.   PHYSICAL EXAM: VS:  BP 120/76  Pulse 75  Ht 6' (1.829 m)  Wt 207 lb (93.895  kg)  BMI 28.07 kg/m2 Well nourished, well developed, in no acute distress HEENT: normal Neck: no JVD, no carotid bruits Cardiac:  normal S1, S2; Irregularly irregular Lungs:  clear to auscultation bilaterally, no wheezing, rhonchi or rales Abd: soft, nontender, no hepatomegaly Ext: mild bilateral edema Skin: warm and dry Neuro:   no focal abnormalities noted  EKG:  AFib , RBBB   , no ST segment changes  ASSESSMENT AND PLAN:  Atrial fibrillation, persistent  Notes: Coumadin for stroke prevention.  encouraged him to try to exercise to improve stamina. He can also try low weights to build muscle.  2. Essential hypertension, benign  Notes: Controlled today.  3. Hypercholesteremia  Continue Atorvastatin Calcium, 10 MG, 1 tablet, once a day Notes: Lipids well controlled. He does not need his lipids checked in July.  May 2014 LDL 56, HDL 57.  4. Diastolic CHF  Continue Furosemide Tablet, 40 MG, 2 tablets, twice a day Notes: Seems euvolemic.  5. Anticoagulant monitoring  Notes: INR to be checked in Coumadin dose to be  adjusted with Pharm.D. Please see his note for details. Low AT III by prior hypercoag w/u at time of PE.     Signed, Fredric Mare, MD, [facility] 09/23/2013 12:40 PM

## 2013-10-03 ENCOUNTER — Telehealth: Payer: Self-pay | Admitting: Pharmacist

## 2013-10-03 ENCOUNTER — Ambulatory Visit (INDEPENDENT_AMBULATORY_CARE_PROVIDER_SITE_OTHER): Payer: 59 | Admitting: Pharmacist

## 2013-10-03 DIAGNOSIS — I2699 Other pulmonary embolism without acute cor pulmonale: Secondary | ICD-10-CM

## 2013-10-03 DIAGNOSIS — I4891 Unspecified atrial fibrillation: Secondary | ICD-10-CM

## 2013-10-03 LAB — POCT INR: INR: 2.2

## 2013-10-03 NOTE — Telephone Encounter (Addendum)
Patient currently on amlodipine 2.5 mg qd, metoprolol 100 mg daily, and lisinopril 5 mg qd.  Patient stated he fell last week and he thinks it was due to low blood pressure.  His blood pressure has been ~ 120/74 in the office, and he also states it has been ~ 120/74 at home as well, but he isn't checking his blood pressure that often.  He is on coumadin for AFib/PE.  He states he has fainted twice over past 2 months.  Patient and I discussed his situation at length.  He didn't have these falling episodes before being put on amlodipine.    Plan: 1.  I advised patient to stop the amlodipine for now, and check his blood pressure twice daily for the next week. 2.  If SBP gets > 160 mmHg he agrees to restart amlodipine 2.5 mg qd. 3.  He will call in 1 week with his BP results and let us know if he has fainted again.  To Dr. Eldridge Dace as Lorain Childes.

## 2013-10-03 NOTE — Patient Instructions (Addendum)
Plan: 1.  I advised patient to stop the amlodipine for now, and check his blood pressure twice daily for the next week. 2.  If SBP gets > 160 mmHg he agrees to restart amlodipine 2.5 mg qd. 3.  He will call in 1 week with his BP results and let us know if he has fainted again

## 2013-10-05 NOTE — Telephone Encounter (Signed)
Agree 

## 2013-10-10 ENCOUNTER — Telehealth: Payer: Self-pay | Admitting: Pharmacist

## 2013-10-10 NOTE — Telephone Encounter (Signed)
Patient stopped his amlodipine 2.5 mg qd last week due to two different "fainting" spells in mid November and mid December.  He is still on metoprolol and lisinopril.  His home blood pressures since being off amlodipine average ~ 135/85.  He checked his BP 60 times over past week, and only seven times was the SBP over 150.  These were all in the low 150's.  His pulse is averaging 76 as well per patient.  He hasn't had anymore spells of feeling as if he was going to faint.  Patient due to see me again in 2 weeks and will bring more BP readings with him at that time.  He is to call if SBP > 160 between now and then as low dose amlodipine or slightly higher lisinopril dose may need to be used.  Plan: 1.  Patient notified to remain off amlodipine for now as nearly all readings were < 150/90, and given his age and being on coumadin, we didn't want to increase likelihood for falls. 2.  Patient will continue to check BP bid and bring readings in 2 weeks. 3.  Dr. Eldridge Dace, please let me know if you would like anything done differently.

## 2013-10-11 NOTE — Telephone Encounter (Signed)
Agree.  We should put a life watch continupus telemetry monitor on him to evaluate for symptomatic bradycardia.

## 2013-10-12 ENCOUNTER — Telehealth: Payer: Self-pay | Admitting: Interventional Cardiology

## 2013-10-12 DIAGNOSIS — R001 Bradycardia, unspecified: Secondary | ICD-10-CM

## 2013-10-12 NOTE — Telephone Encounter (Signed)
Spoke with pt and explained to him that Dr. Eldridge Dace wants him to wear a monitor. Will get scheduling to get him an appt

## 2013-10-12 NOTE — Telephone Encounter (Signed)
New Problem:  Pt called to schedule his heart monitor. Pt doesn't have an order. Pt is asking if he needs one. Pt is requesting a call back.

## 2013-10-12 NOTE — Telephone Encounter (Signed)
See last telephone note, pt does need heart monitor. Heart monitor ordered. To Jasmine December to schedule. Thank you.

## 2013-10-12 NOTE — Telephone Encounter (Signed)
Forwarding to [place] to help arrange this.

## 2013-10-14 ENCOUNTER — Telehealth: Payer: Self-pay | Admitting: Radiology

## 2013-10-14 DIAGNOSIS — R001 Bradycardia, unspecified: Secondary | ICD-10-CM

## 2013-10-14 NOTE — Telephone Encounter (Signed)
Pt came in to have lifewatch monitor placed today. After 45 mins explaining the need for the monitor. He decided he didn't want it at this time.

## 2013-10-24 ENCOUNTER — Telehealth: Payer: Self-pay | Admitting: Pharmacist

## 2013-10-24 ENCOUNTER — Ambulatory Visit (INDEPENDENT_AMBULATORY_CARE_PROVIDER_SITE_OTHER): Payer: 59 | Admitting: Pharmacist

## 2013-10-24 DIAGNOSIS — I4891 Unspecified atrial fibrillation: Secondary | ICD-10-CM

## 2013-10-24 DIAGNOSIS — I2699 Other pulmonary embolism without acute cor pulmonale: Secondary | ICD-10-CM

## 2013-10-24 LAB — POCT INR: INR: 2.5

## 2013-10-24 NOTE — Telephone Encounter (Signed)
FYI  - BP at home has been approximately 145/90 for past week.  Stopped amlodipine weeks ago due to dizziness/falls and hypotension.   He will remain off amlodipine for now, and will let us know if SBP gets over 160 consistently.

## 2013-10-28 ENCOUNTER — Other Ambulatory Visit: Payer: Self-pay | Admitting: Interventional Cardiology

## 2013-12-12 ENCOUNTER — Ambulatory Visit (INDEPENDENT_AMBULATORY_CARE_PROVIDER_SITE_OTHER): Payer: 59 | Admitting: Pharmacist

## 2013-12-12 DIAGNOSIS — I4891 Unspecified atrial fibrillation: Secondary | ICD-10-CM

## 2013-12-12 DIAGNOSIS — I2699 Other pulmonary embolism without acute cor pulmonale: Secondary | ICD-10-CM

## 2013-12-12 LAB — POCT INR: INR: 2.6

## 2013-12-30 ENCOUNTER — Other Ambulatory Visit: Payer: Self-pay | Admitting: Interventional Cardiology

## 2014-03-30 ENCOUNTER — Ambulatory Visit (INDEPENDENT_AMBULATORY_CARE_PROVIDER_SITE_OTHER): Payer: 59 | Admitting: Interventional Cardiology

## 2014-03-30 ENCOUNTER — Encounter: Payer: Self-pay | Admitting: Interventional Cardiology

## 2014-03-30 VITALS — BP 156/98 | HR 65 | Ht 72.0 in | Wt 209.0 lb

## 2014-03-30 DIAGNOSIS — Z7901 Long term (current) use of anticoagulants: Secondary | ICD-10-CM

## 2014-03-30 DIAGNOSIS — I4891 Unspecified atrial fibrillation: Secondary | ICD-10-CM

## 2014-03-30 DIAGNOSIS — I5032 Chronic diastolic (congestive) heart failure: Secondary | ICD-10-CM

## 2014-03-30 DIAGNOSIS — E782 Mixed hyperlipidemia: Secondary | ICD-10-CM

## 2014-03-30 DIAGNOSIS — I10 Essential (primary) hypertension: Secondary | ICD-10-CM

## 2014-03-30 DIAGNOSIS — I482 Chronic atrial fibrillation, unspecified: Secondary | ICD-10-CM

## 2014-03-30 NOTE — Progress Notes (Signed)
Patient ID: Jason Ritter, male   DOB: [birth year], 80 y.o.   MRN: [id number] Pa   [address] [place], [place]  [address] Phone: [phone](336) [phone] Fax:  [phone]) [phone]  Date:  03/30/2014   ID:  Jason Ritter, DOB [birth year], MRN [id number]  PCP:  Pearla DubonnetGATES,ROBERT NEVILL, MD      History of Present Illness: Jason Ritter is a 80 y.o. male with known diastolic dysfunction and atrial fibrillation. His shortness of breath but this improved. He is able to lie flat. he does not report any chest pain. He has lost weight.  He feels that his fluid status is better.  His gait is more steady as well.  He took a trip to [place] as well. Went to [place] in 2014.  No bleeding problems.   No falls.  His SBPs usually in the 130s. Feels about the same since his last vist. Still feels weak and no strength. Still has chronic shortness of breath with exertion and LE edema, but feels LE edema is better. Denies chest pain, palpitations,  syncope, fall, shortness breath at rest, orthopnea, PND. No bleeding problems. Rechecked BP 130/80.   Wt Readings from Last 3 Encounters:  03/30/14 209 lb (94.802 kg)  09/23/13 207 lb (93.895 kg)  09/05/11 211 lb 6.4 oz (95.89 kg)     Past Medical History  Diagnosis Date  . CHF (congestive heart failure)   . Afib   . Shingles   . Pulmonary embolism   . Hyperlipidemia   . Glaucoma   . Hypothyroidism   . Hypertension     Current Outpatient Prescriptions  Medication Sig Dispense Refill  . AFLURIA PRESERVATIVE FREE injection       . AZOPT 1 % ophthalmic suspension       . furosemide (LASIX) 40 MG tablet TAKE (2) TABLETS TWICE DAILY.-----80 MG in the morning and 40 MG in the evening      . levothyroxine (SYNTHROID, LEVOTHROID) 112 MCG tablet Take 112 mcg by mouth daily.        [name]. lisinopril (PRINIVIL,ZESTRIL) 5 MG tablet TAKE 1 TABLET ONCE DAILY.  90 tablet  1  . LUMIGAN 0.01 % SOLN Place 1 drop into the right eye at bedtime.       . metoprolol succinate  (TOPROL-XL) 100 MG 24 hr tablet TAKE 1 TABLET DAILY.  90 tablet  1  . temazepam (RESTORIL) 30 MG capsule Take 30 mg by mouth at bedtime as needed. For sleep.       [name]. warfarin (COUMADIN) 4 MG tablet Take 4 mg by mouth as directed. Patient takes 4 mg daily Tuesday through Sunday and 2 mg on Monday. Last INR in clinic 2.4.        No current facility-administered medications for this visit.    Allergies:   No Known Allergies  Social History:  The patient  reports that he has never smoked. He does not have any smokeless tobacco history on file. He reports that he drinks about 4.2 ounces of alcohol per week. He reports that he does not use illicit drugs.   Family History:  The patient's family history includes Cancer in his sister; Heart failure in his father; Stroke in his mother.   ROS:  Please see the history of present illness.  No nausea, vomiting.  No fevers, chills.  No focal weakness.  No dysuria. Mild edema in bilateral lower extremities.  More energy.   All other systems reviewed and negative.  PHYSICAL EXAM: VS:  BP 156/98  Pulse 65  Ht 6' (1.829 m)  Wt 209 lb (94.802 kg)  BMI 28.34 kg/m2 Well nourished, well developed, in no acute distress HEENT: normal Neck: no JVD, no carotid bruits Cardiac:  normal S1, S2; Irregularly irregular Lungs:  clear to auscultation bilaterally, no wheezing, rhonchi or rales Abd: soft, nontender, no hepatomegaly Ext: mild bilateral edema Skin: warm and dry Neuro:   no focal abnormalities noted  EKG:  AFib , RBBB   , no ST segment changes in 12/14  ASSESSMENT AND PLAN:  Atrial fibrillation, persistent  Notes: Coumadin for stroke prevention.  encouraged him to try to exercise to improve stamina. He can also try low weights to build muscle.  2. Essential hypertension, benign  Notes: Controlled today. BP recheck in the office 130/80. 3. Hypercholesteremia  Continue Atorvastatin Calcium, 10 MG, 1 tablet, once a day Notes: Lipids well controlled.  He does not need his lipids checked in July.  May 2014 LDL 56, HDL 57. June 2015, LDL 77. 4. Diastolic CHF  Continue Furosemide Tablet, 40 MG, 2 tablets in the AM, one tab in the PM. Notes: Seems euvolemic.  Cr. 1.3 at last check in June 2015. 5. Anticoagulant monitoring  Notes: INR to be checked in Coumadin dose to be adjusted with Dr. Kevan NyGates. Low AT III by prior hypercoag w/u at time of PE years ago.   Signed, Fredric MareJay S. [place] Ice, MD, [facility] 03/30/2014 9:50 AM

## 2014-03-30 NOTE — Patient Instructions (Signed)
Your physician recommends that you continue on your current medications as directed. Please refer to the Current Medication list given to you today.  Your physician wants you to follow-up in: 6 months. You will receive a reminder letter in the mail two months in advance. If you don't receive a letter, please call our office to schedule the follow-up appointment.  

## 2014-03-31 DIAGNOSIS — Z7901 Long term (current) use of anticoagulants: Secondary | ICD-10-CM | POA: Insufficient documentation

## 2014-05-11 ENCOUNTER — Other Ambulatory Visit: Payer: Self-pay | Admitting: Interventional Cardiology

## 2014-05-29 ENCOUNTER — Other Ambulatory Visit: Payer: Self-pay | Admitting: Interventional Cardiology

## 2014-06-28 ENCOUNTER — Other Ambulatory Visit: Payer: Self-pay | Admitting: Interventional Cardiology

## 2014-06-29 NOTE — Telephone Encounter (Signed)
Spoke with pt to clarify. Pt states he is taking lasix 40 mg 2 tablets in the am and 1 tablet in the pm per Dr. Hoyle Barr last note. Rx refilled.

## 2014-06-29 NOTE — Telephone Encounter (Signed)
Can you clarify sig on this? The office note has 2 different directions. Thanks, MI

## 2014-07-24 ENCOUNTER — Other Ambulatory Visit (HOSPITAL_COMMUNITY): Payer: Self-pay | Admitting: Internal Medicine

## 2014-07-24 ENCOUNTER — Ambulatory Visit (HOSPITAL_COMMUNITY): Payer: Medicare Other | Attending: Cardiology | Admitting: Radiology

## 2014-07-24 DIAGNOSIS — E785 Hyperlipidemia, unspecified: Secondary | ICD-10-CM | POA: Diagnosis not present

## 2014-07-24 DIAGNOSIS — I509 Heart failure, unspecified: Secondary | ICD-10-CM

## 2014-07-24 DIAGNOSIS — I5030 Unspecified diastolic (congestive) heart failure: Secondary | ICD-10-CM | POA: Diagnosis not present

## 2014-07-24 DIAGNOSIS — I10 Essential (primary) hypertension: Secondary | ICD-10-CM | POA: Diagnosis not present

## 2014-07-24 DIAGNOSIS — R609 Edema, unspecified: Secondary | ICD-10-CM

## 2014-07-24 DIAGNOSIS — I482 Chronic atrial fibrillation, unspecified: Secondary | ICD-10-CM

## 2014-07-24 NOTE — Progress Notes (Signed)
Echocardiogram performed.  

## 2014-08-14 ENCOUNTER — Other Ambulatory Visit: Payer: Self-pay | Admitting: Interventional Cardiology

## 2014-08-15 ENCOUNTER — Other Ambulatory Visit: Payer: Self-pay | Admitting: *Deleted

## 2014-08-21 ENCOUNTER — Encounter: Payer: Self-pay | Admitting: Interventional Cardiology

## 2014-08-21 ENCOUNTER — Ambulatory Visit (INDEPENDENT_AMBULATORY_CARE_PROVIDER_SITE_OTHER): Payer: 59 | Admitting: Interventional Cardiology

## 2014-08-21 VITALS — BP 132/76 | HR 75 | Ht 72.0 in | Wt 204.1 lb

## 2014-08-21 DIAGNOSIS — I5032 Chronic diastolic (congestive) heart failure: Secondary | ICD-10-CM

## 2014-08-21 DIAGNOSIS — I272 Pulmonary hypertension, unspecified: Secondary | ICD-10-CM

## 2014-08-21 DIAGNOSIS — I270 Primary pulmonary hypertension: Secondary | ICD-10-CM

## 2014-08-21 DIAGNOSIS — J948 Other specified pleural conditions: Secondary | ICD-10-CM

## 2014-08-21 DIAGNOSIS — I482 Chronic atrial fibrillation, unspecified: Secondary | ICD-10-CM

## 2014-08-21 DIAGNOSIS — J90 Pleural effusion, not elsewhere classified: Secondary | ICD-10-CM

## 2014-08-21 DIAGNOSIS — I10 Essential (primary) hypertension: Secondary | ICD-10-CM

## 2014-08-21 NOTE — Progress Notes (Signed)
Patient ID: Jason Ritter, male   DOB: May 03, 1929, 80 y.o.   MRN: [id number]    [address] [place], [place]  [address] Phone: [phone] Fax:  [phone]  Date:  08/21/2014   ID:  Jason SprayJames N Ritter, DOB May 03, 1929, MRN [id number]  PCP:  Pearla DubonnetGATES,ROBERT NEVILL, MD      History of Present Illness: Jason Ritter is a 80 y.o. male with known diastolic dysfunction and atrial fibrillation.  He is concerned about his recent echo findings.  He had severe oulmonary HTN and RV dysfunction.  He still has DOE.  Walking around the house is ok and walking through the grocery store is ok.  He is limiting his travelling due to trouble walking long distances.    He has LE swelling.  Better with compression stockings.  No bleeding issues or falls.  Denies chest pain, palpitations, syncope, fall, shortness breath at rest, orthopnea, PND. No bleeding problems.    Wt Readings from Last 3 Encounters:  08/21/14 204 lb 1.9 oz (92.588 kg)  03/30/14 209 lb (94.802 kg)  09/23/13 207 lb (93.895 kg)     Past Medical History  Diagnosis Date  . CHF (congestive heart failure)   . Afib   . Shingles   . Pulmonary embolism   . Hyperlipidemia   . Glaucoma   . Hypothyroidism   . Hypertension     Current Outpatient Prescriptions  Medication Sig Dispense Refill  . AZOPT 1 % ophthalmic suspension     . furosemide (LASIX) 40 MG tablet 2 tablets in the am and 1 tablet in the pm (Patient taking differently: Take 40 mg by mouth daily. Taking 2 tablets 80mg  in the afternoon on Monday, Wed. Fri.         Taking one tablet  40mg  tab Tues., Thurs. Sat, Sun afternoon.) 270 tablet 1  . levothyroxine (SYNTHROID, LEVOTHROID) 112 MCG tablet Take 112 mcg by mouth daily.      [name]. lisinopril (PRINIVIL,ZESTRIL) 5 MG tablet TAKE 1 TABLET ONCE DAILY. 90 tablet 0  . LUMIGAN 0.01 % SOLN Place 1 drop into the right eye at bedtime.     . metoprolol succinate (TOPROL-XL) 100 MG 24 hr tablet TAKE 1 TABLET DAILY. 90 tablet 1  .  temazepam (RESTORIL) 30 MG capsule Take 30 mg by mouth at bedtime as needed. For sleep.     [name]. warfarin (COUMADIN) 4 MG tablet Take 4 mg by mouth as directed. Patient takes 4 mg daily Tuesday through Sunday and 2 mg on Monday. Last INR in clinic 2.4.      No current facility-administered medications for this visit.    Allergies:   No Known Allergies  Social History:  The patient  reports that he has never smoked. He does not have any smokeless tobacco history on file. He reports that he drinks about 4.2 oz of alcohol per week. He reports that he does not use illicit drugs.   Family History:  The patient's family history includes Cancer in his sister; Heart failure in his father; Stroke in his mother.   ROS:  Please see the history of present illness.  No nausea, vomiting.  No fevers, chills.  No focal weakness.  No dysuria. Fatigue, DOE as noted above. Edema.   All other systems reviewed and negative.   PHYSICAL EXAM: VS:  BP 132/76 mmHg  Pulse 75  Ht 6' (1.829 m)  Wt 204 lb 1.9 oz (92.588 kg)  BMI 27.68 kg/m2  General: Well developed, well nourished, in no acute distress HEENT: normal  Neck: no JVD , no carotid bruits Cardiac: normal S1, S2; Irregularly irregular  Lungs: clear to auscultation bilaterally, no wheezing, rhonchi or rales  Abd: soft, nontender, no hepatomegaly  Ext: mild bilateral edema  Skin: warm and dry  Neuro: no focal abnormalities noted Psych: normal affect  EKG:  AFib, RBBB  - no change from prior.  ASSESSMENT AND PLAN:  1) Atrial fibrillation, persistent  Notes: Coumadin for stroke prevention.  encouraged him to try to exercise to improve stamina. He can also try low weights to build muscle- he has not tried this as of yet. No palpitations.  2. Essential hypertension, benign  Notes: Controlled today. BP recheck in the office 130/80. 3. Hypercholesteremia  Continue Atorvastatin Calcium, 10 MG, 1 tablet, once a day Notes: Lipids well controlled. He does  not need his lipids checked in July. May 2014 LDL 56, HDL 57. June 2015, LDL 77. 4. Diastolic CHF  Continue Furosemide Tablet, 40 MG, 2 tablets in the AM, one -two  tabs in the PM ( 3 x/week on the higher dose). Notes: Seems euvolemic- but echo should. Cr. 1.3 at last check in June 2015.  Balancing diuresis with CRI. Given the pleural effusion, will increase PM lasix to 80 mg daily for a week.  THen return to prior dose. 5. Anticoagulant monitoring  Notes: INR to be checked in Coumadin dose to be adjusted with Dr. Kevan NyGates. Low AT III by prior hypercoag w/u at time of PE years ago.  6. Pulmonary hypertension: He is curious about is he would be a candidate for any newer pulmonary HTN meds.  Will refer to Dr. Shirlee LatchMcLean to see whether he would be a candidate for any pulmonary HTN meds.  7. Edema: continue to use stockings and diuretics.  Signed, Fredric MareJay S. Amariyon Maynes, MD, [facility] 08/21/2014 10:44 AM

## 2014-08-21 NOTE — Patient Instructions (Signed)
Your physician recommends that you schedule a follow-up appointment as scheduled with Dr Lyn HollingsheadVaranasi  You have been referred to CHF clinic for Pulmonary HTN   Your physician has recommended you make the following change in your medication:  Increase your Furosemide for 1 week to 80mg  twice daily

## 2014-08-24 ENCOUNTER — Other Ambulatory Visit: Payer: Self-pay | Admitting: Interventional Cardiology

## 2014-09-05 ENCOUNTER — Ambulatory Visit (HOSPITAL_COMMUNITY)
Admission: RE | Admit: 2014-09-05 | Discharge: 2014-09-05 | Disposition: A | Discharge status: Home / Self Care | Payer: Medicare Other | Source: Ambulatory Visit | Attending: Cardiology | Admitting: Cardiology

## 2014-09-05 DIAGNOSIS — I272 Other secondary pulmonary hypertension: Secondary | ICD-10-CM | POA: Insufficient documentation

## 2014-09-05 DIAGNOSIS — I481 Persistent atrial fibrillation: Secondary | ICD-10-CM | POA: Insufficient documentation

## 2014-09-05 DIAGNOSIS — I5032 Chronic diastolic (congestive) heart failure: Secondary | ICD-10-CM | POA: Diagnosis not present

## 2014-09-05 DIAGNOSIS — Z7901 Long term (current) use of anticoagulants: Secondary | ICD-10-CM | POA: Diagnosis not present

## 2014-09-05 DIAGNOSIS — J90 Pleural effusion, not elsewhere classified: Secondary | ICD-10-CM | POA: Insufficient documentation

## 2014-09-05 DIAGNOSIS — J9610 Chronic respiratory failure, unspecified whether with hypoxia or hypercapnia: Secondary | ICD-10-CM | POA: Insufficient documentation

## 2014-09-05 DIAGNOSIS — D71 Functional disorders of polymorphonuclear neutrophils: Secondary | ICD-10-CM | POA: Insufficient documentation

## 2014-09-05 DIAGNOSIS — I270 Primary pulmonary hypertension: Secondary | ICD-10-CM

## 2014-09-05 MED ORDER — FUROSEMIDE 80 MG PO TABS: 80.0000 mg | ORAL_TABLET | Freq: Two times a day (BID) | ORAL | Status: DC

## 2014-09-05 NOTE — Progress Notes (Signed)
SATURATION QUALIFICATIONS: (This note is used to comply with regulatory documentation for home oxygen)  Patient Saturations on Room Air at Rest = 85%  Patient Saturations on Room Air while Ambulating = 75%  Patient Saturations on 3 Liters of oxygen while Ambulating = 94%  Please briefly explain why patient needs home oxygen: Pt has pulmonary hypertension

## 2014-09-05 NOTE — Patient Instructions (Addendum)
Increase Furosemide (Lasix) to 80 mg Twice daily, we have sent you in a new prescription for 80 mg tablets   Please wear compression stockings daily  Chest x-ray  Your physician has recommended that you have a pulmonary function test. Pulmonary Function Tests are a group of tests that measure how well air moves in and out of your lungs.  VQ Scan  Start wearing home oxygen, [facility] will contact you to arrange delivery  You have been referred to Pulmonary, Dr Claretta FraiseMcQuaid  Labs in 1 week  Your physician recommends that you schedule a follow-up appointment in: 3-4 weeks

## 2014-09-05 NOTE — Progress Notes (Signed)
Patient ID: Jason Ritter, male   DOB: Nov 05, 1928, 80 y.o.   MRN: [id number]   Date:  09/05/2014  ID:  Jason Ritter, DOB Nov 05, 1928, MRN [id number] PCP:  GATES,ROBERT NEVILL, MD     History of Present Illness: Jason Ritter is a 80 y.o. 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  He has been followed by Dr. Varanasi. He is referred for further evaluation of pulmonary HTN on recent echo.   Had PE in 2006 and found to have ATIII deficiency. Took coumadin. In 2012 found to have diastolic HF.  Echo 4/12 RVSP <MEASUREMESt Fr516 [phone] DupLaurell JosephsGeoDimensions Surgery Centerr5Lore9634 Holly QIOClinical research associ39Third [address] SurgeKatrinka Blazing Uintah Basin Care And RehabilitatioMarland KitchennKindred Hospital 60<MEASUREMENTWellstar [phone] DupLaurell JosephsGeoWillapa Harbor Hospitalr47Loreta Ave8613 West ElmwoQIOClinical research associ20Ocshner St. Anne GeneKatrinka Blazingl7West [place] EndoscopMarland KitchenyAmbulatory Surgical As53<MEASUREMENTMerc401 [phone] DupLaurell JosephsGeoKindred Hospital Ontarior3Loreta Ave9649 South Bow RidgeQIOClinical research associ48Upper [place] MeKatrinka Blazingc10 NorReynolds Road Surgical Center LtMarland KitchendSamaritan Hospit77<MEASUREMENTSage [phone] DLaurell JosephsGeoBrooke Army Medical Centerr76Loreta A7914 Thorne QIOClinical research associ26aPelham MeKatrinka BlazingKindred Hospital - Los AngeleMarland KitchensDupont 38<MEASUREMENTRock Prairie [phone]-[phone] Dup(7Laurell JosephsGeoSouthern California Medical Gastroenterology Group Incr42Loreta799 TalboQIOClinical research associ36aMyrtue MemorKatrinka BlaziThe Heights HospitaMarland KitchenlMountainv57<MEASUREMENTDigestive40805 698 15Oke76y DupLaurell JosephsGeoKilbarchan Residential Treatment Centerr2Loreta Av9279 StaQIOClinical research associ59Regional Medical Center BKatrinka BlazingoGeorge C Grape Community HospitaMarland KitchenlEye Surgicenter O77<MEASUREMENTIngalls Mem214-[phone] DupLaurell JosephsGeoRimrock Foundationr66Loreta Av724 PrinceQIOClinical research associ46Vanderbilt Stallworth RehabilitatKatrinka BlazingnUpstate Orthopedics Ambulatory Surgery Center LLMarland KitchenCHamil62<MEASUREMENTEaton Rapids [phone] Dup(9Laurell JosephsGeoWinnie Palmer Hospital For Women & Babiesr46Loreta Ave104 VernQIOClinical research assocKatrinka Blazing69122 SouthGreenwood County HospitaMarland KitchenlMethodist Hospital Of Souther66<MEASUREMENTCmmp Surgi301-[phone] DupLaurell JosephsGeoPhs Indian Hospital At Browning Blackfeetr67Loreta Av9953 Berkshire QIOClinical research associ82Endoscopy Center Of BucKatrinka Blazing 931NavoMarland KitchensPioneer Ambulatory Surger43<MEASUREMENTThe Hand And Upper Extremity Surgery Center [phone] Dup(Laurell JosephsGeoSurgical Licensed Ward Partners LLP Dba Underwood Surgery Centerr3Loreta 9709 Wild HorQIOClinical research associ22a(Midwestern RegioKatrinka BlazingM833 RandIllinois Valley Community HospitaMarland KitchenlPoplar Spri71<MEASUREMENTGastroenterology Consultants Of [phone] DupLaurell JosephsGeoSt Peters Ascr3Loreta AveB74 MulberQIOClinical research associ42Androscoggin ValKatrinka Blazingy8714 Potom[facility] View Surgery Center LLMarland KitchenCValley West Commun32<MEASUREMENTNorthwestern Lake [phone] DLaurell JosephsGeAthens Eye Surgery Centeror7Loret121 FordhaQIOClinical research associ68aNational Park MeKatrinka Blazingc81 WestCvp Surgery CenteMarland KitchenrGreensboro Ophthalmo49<MEASUREMENTLsu [phone] Dup(Laurell JosephsGeoNorwalk Hospitalr95Loreta Av47 Lakeshore QIOClinical research associ54Centracare HealthKatrinka Blazingy13 Wellstar Kennestone HospitaMarland KitchenlChristus Mother Frances Hospital -19<MEASUREMENTSelect Specialty [phone] DupLaurell JosephsGeoHarrisburg Endoscopy And Surgery Center Incr5Loreta590 Tower QIOClinical research associ58North Garland Surgery Center LLP Dba Baylor Scott And White Surgicare NKatrinka Blazingt92Houston Behavioral [facility] Hospital LLMarland KitchenCPeters Endo86<MEASUREMENTTemecula Ca Endoscopy Asc LP Dba United Surgery C312-[phone] DupLaurell JosephsGeoSt Joseph Hospitalr62Loreta Ave310 Cactus QIOClinical research associ90aRound Rock MeKatrinka Blazingc7232C Pediatric Surgery Centers LLMarland KitchenCVa [place] Healt67<MEASUREMENTPhs Indian [phone] DupLaurell JosephsGeoLegent Orthopedic + Spiner4Loreta179 HudsQIOClinical research assocHale HoKatrinka Blazingl44Childrens Medical Center PlanMarland KitchenoArc Worcester Center LP Dba Worcester Sur62<MEASUREMENTAcoma-Canoncito-Laguna ([phone] DupLaurell JosephsGeoGibson General Hospitalr43Loreta AveWest6 W. SierrQIOClinical research associ53R[place] SuKatrinka Blazinge360 EaNyu Lutheran Medical CenteMarland KitchenrNorth Bay Vacaval96<MEASUREMENTNew [place] Ba212-[phone] DupLaurell JosephsGeLincoln Digestive Health Center LLCorLoret8959 FairviewQIOClinical research associ86Uva Kluge Childrens RehabilitKatrinka BlazingCovenant Medical CenteMarland KitchenrMe2<MEASUREMENTMinnesota Eye Institute [phone] Dup(Laurell JosephsGeoConway Endoscopy Center Incr62Loreta A1 West DepQIOClinical research associ76Dupont Katrinka BlazingsAvera Behavioral Health CenteMarland KitchenrPrai71<MEASUREMENTOrlando Va [phone] DupLaurell JosephsGeoSt Dyon Healthcarer2Loreta Av123 S. ShorQIOClinical research associ26Gastrointestinal Associates EndoscopKatrinka BlazingC26Childrens Healthcare Of Atlanta At Scottish RitMarland KitcheneRockland Surgical36<MEASUREMENTMinnesota Eye Institute [phone] DupLaurell JosephsGeoAvera St Mary'S Hospitalr6Loreta AveEl10 [place] QIOClinical research associ38Ant[place] Valley SurgeKatrinka Blazing 8102 MDeerpath Ambulatory Surgical Center LLMarland KitchenCBanner - University Medical Center Ph51<MEASUREMENTTucson Digestive Institute LLC Dba Arizona [phone] DupLaurell JosephsGeoSt. Marys Hospital Ambulatory Surgery Centerr34Loreta Av16 PacificQIOClinical research associ83a(North Shore EndoscopKatrinka BlazingC8019 Sterlington Rehabilitation HospitaMarland KitchenlArkansas Specialty Su4<MEASUREMENTCarroll County Eye Surg602-[phone] Dup(Laurell JosephsGeoKaiser Fnd Hosp - South San Franciscor9Loreta Av9782 East AddisoQIOClinical research associ78aCuero CommunKatrinka BlazingUoc Surgical Services LtMarland KitchendNeospine Puyallup Spin71<MEASUREMENTAuburn [phone] DupLaurell JosephsGeoMount Carmel St Ann'S Hospitalr3Loreta 834 Mechanic QIOClinical research associ65a(Regional West MeKatrinka Blazingc7173Loma Linda University Medical CenteMarland KitchenrSouth 37<MEASUREMENTMid Columbia Endosc501-[phone] DLaurell JosephsGeoFour Winds Hospital Westchesterr24Loret7337 CharlQIOClinical research associ52Hill RegioKatrinka Blazingl75 NoBaptist Physicians Surgery CenteMarland KitchenrCallaway Distr81<MEASUREMENTSt. Anthony'S Reg254 [phone] Dup(7Laurell JosephsGeoPella Regional Health Centerr8Loreta AveT73 CambridQIOClinical research associ62SKatrinka BlazingP832Longview Surgical Center LLMarland KitchenCSummers County 37<MEASUREMENTSurgery Center Of [facility] D B A [facility] Surgery Center [phone] DLaurell JosephsGeoCommunity Heart And Vascular Hospitalr92Loreta 64 BradfoQIOClinical research associ53West Hills [facility] And MeKatrinka Blazingc9929Denver Mid Town Surgery Center LtMarland KitchendYuma Distr34<MEASUREMENTDr. P([phone] DupLaurell JosephsGeoCalifornia Pacific Medical Center - Van Ness Campusr7Loret7127 SelQIOClinical research associ59Anthony M YelencsiKatrinka BlazinFranciscan St Elizabeth Health - Lafayette EasMarland KitchentSurgicare Of24<MEASUREMENTAllianceh([phone])[phone] Dup(Laurell JosephsGeoNorth Texas Team Care Surgery Center LLCr7Lore306 2QIOClinical research associ86[employer]so Equipment Corp Dba The [facility] Endoscopy CeKatrinka Blazinge9757 BHighland HospitaMarland KitchenlBig Horn County Memor98<MEASUREMENTThe Orthopedic Spec([phone] DupLaurell JosephsGeoSt. John'S Episcopal Hospital-South Shorer2Lor9665 CarsQIOClinical research associ29Austin Gi Surgicenter LLC Dba Austin Gi SuKatrinka Blazingi7926 CVail Valley Medical CenteMarland KitchenrCentral Washing15ton Hospitallligan:[phone] effusion at that time. Did not have thoracentesis. Last chest CT in 2013 as below with moderate to large left pleural effusion and granulomatous disease. No PE.   Myoview 5/12. ? Mild reversible defect in mid-anterolateral wall EF 65%. Managed medically.   Currently able to get around the house and do ADLs without difficulty. Can walk in store but gets weak and tired in his legs. Gets fatigued after walking 40 yards. + severe edema. Denies chest pain, palpitations, syncope, fall, shortness breath at rest, orthopnea, PND. No bleeding problems. Weight stable. Takes lasix 80/80 on MWF and T, Th, Sat, Sun 80/40. Has compressing stockings but doesn't wear them.   Dr. Gates saw him recently and obtained echo. There was severe pulmonary HTN and RV dysfunction. He was referred here for further evaluation.    Echo 10/15:  - Left ventricle: The cavity size was normal. There was mild focal basal hypertrophy of the septum. Systolic function was normal. The estimated ejection fraction was in the range of 55% to 60%. Wall motion was normal; there were no regional wall motion abnormalities. - Ventricular septum: The contour showed diastolic flattening and systolic flattening. - Aortic root: The aortic root was mildly dilated. - Left atrium: The  atrium was mildly dilated. - Right ventricle: The cavity size was severely dilated. Systolic function was severely reduced. - Right atrium: The atrium was severely dilated. - Pulmonary arteries: Systolic pressure was severely increased. PA peak pressure: 83 mm Hg (S). - Pericardium, extracardiac: There was a left pleural effusion.  Chest CT 3/13:  1. No PE 2. Little change in moderate to large left pleural effusion with dense passive atelectasis of the part of the left lower lobe and lingula. 3. Stable primarily right lung nodules. No definite new or enlarging nodule. 4. Slight irregularity of the mucosa of the mid distal esophagus. Consider barium swallow to assess further. 5. Cardiomegaly and coronary artery calcifications. 6. Calcified granulomas and right hilar nodes consistent with prior granulomatous disease.    Labs  3/14: K 4.6 Creatinine 1.50 Labs  5/14: K 3.8 Creatinine 1.24   Wt Readings from Last 3 Encounters:  09/05/14 207 lb (93.895 kg)  08/21/14 204 lb 1.9 oz (92.588 kg)  03/30/14 209 lb (94.802 kg)     Past Medical History  Diagnosis Date  . CHF (congestive heart failure)   . Afib   . Shingles   . Pulmonary embolism   . Hyperlipidemia   . Glaucoma   . Hypothyroidism   . Hypertension     Current Outpatient Prescriptions  Medication Sig Dispense Refill  . AZOPT 1 % ophthalmic suspension     . furosemide (LASIX) 40 MG tablet 2 tablets in the am and 1 tablet in the pm (Patient taking differently: Take 40 mg  by mouth daily. Taking 2 tablets 80mg  in the afternoon on Monday, Wed. Fri.         Taking one tablet  40mg  tab Tues., Thurs. Sat, Sun afternoon.) 270 tablet 1  . levothyroxine (SYNTHROID, LEVOTHROID) 112 MCG tablet Take 112 mcg by mouth daily.      [name]. lisinopril (PRINIVIL,ZESTRIL) 5 MG tablet TAKE 1 TABLET ONCE DAILY. 90 tablet 1  . LUMIGAN 0.01 % SOLN Place 1 drop into the right eye at bedtime.     . metoprolol succinate (TOPROL-XL) 100  MG 24 hr tablet TAKE 1 TABLET DAILY. 90 tablet 1  . temazepam (RESTORIL) 30 MG capsule Take 30 mg by mouth at bedtime as needed. For sleep.     [name]. warfarin (COUMADIN) 4 MG tablet Take 4 mg by mouth as directed. Patient takes 4 mg daily Tuesday through Sunday and 2 mg on Monday. Last INR in clinic 2.4.      No current facility-administered medications for this encounter.    Allergies:   No Known Allergies  Social History:  The patient  reports that he has never smoked. He does not have any smokeless tobacco history on file. He reports that he drinks about 4.2 oz of alcohol per week. He reports that he does not use illicit drugs.   Family History:  The patient's family history includes Cancer in his sister; Heart failure in his father; Stroke in his mother.   ROS:  Please see the history of present illness.  No nausea, vomiting.  No fevers, chills.  No focal weakness.  No dysuria. Fatigue, DOE as noted above. Edema.   All other systems reviewed and negative.   PHYSICAL EXAM: VS:  BP 103/70 mmHg  Pulse 92  Wt 207 lb (93.895 kg)  SpO2 86%   Sats 91% at rest 75% with minimal ambulation  General: Elderly frail male sitting in WC HEENT: normal  Neck: JVP 9, no carotid bruits Cardiac: normal S1, S2; Irregularly irregular  Loud P2 + RV lift. Split s2 Lungs: decreased breath sounds throughout worse in LLL. No wheezing Abd: soft, nontender, no hepatomegaly  Ext: 3+ edema R>>L distal cyanosis and venous stasis changes Skin: warm and dry  Neuro: no focal abnormalities noted Psych: normal affect  EKG:  AFib, RBBB  - no change from prior.  ASSESSMENT AND PLAN:  1) Severe pulmonary HTN with cor pulmonale 2) Chronic respiratory failure 3) Left pleural effusion 4) h/o pulmonary embolism 2006 5) Atrial fibrillation, persistent  Notes: Coumadin for stroke prevention.  encouraged him to try to exercise to improve stamina. He can also try low weights to build muscle- he has not tried this as  of yet. No palpitations.  6) Chronic diastolic HF 7) Granulomatous disease on chest CT  Mr. Rennis Hardingllis has severe secondary pHTN with cor pulmonale in setting of chronic lung disease (unknown etiology) and diastolic HF with severe hypoxia dropping his sats from 91% at rest to 75% with minimal exertion. He will need home O2 and we will increase his lasix to 80 bid daily. On exam he has persistently decreased breath sounds in LLL so will get CXR to assess for persistent L effusion. We will also get PFTs, VQ scan and serologies. He will eventually need a sleep study or ONOX. He has granulomatous disease on CXR which raises the question of previous sarcoid. Will refer to Dr. Kendrick FriesMcQuaid for further evaluation. Currently not a candidate to pulmonary vasodilators as I doubt this is WHO group I PAH.  Plan 1) Start O2 2) PFTs with DLCO 3) Repeat CXR +/- CT 4) Compression hose 5) Increase lasix 80 bid with BMET in 1 week.  6) VQ scan  7) Eventual sleep study 8) Check ACE level, ANA, ANCA, RF 9) Consider RHC in future  Total time spent 60 minutes. Over half that time spent discussing above.   Daniel Bensimhon,MD 2:49 PM

## 2014-09-08 ENCOUNTER — Telehealth (HOSPITAL_COMMUNITY): Payer: Self-pay | Admitting: Anesthesiology

## 2014-09-08 NOTE — Telephone Encounter (Signed)
[facility]   Ulla Pot[place]gro[place], Yasha Tibbett B NP-C 9:46 AM

## 2014-09-14 ENCOUNTER — Ambulatory Visit (HOSPITAL_COMMUNITY)
Admission: RE | Admit: 2014-09-14 | Discharge: 2014-09-14 | Disposition: A | Discharge status: Home / Self Care | Payer: Medicare Other | Source: Ambulatory Visit | Attending: Internal Medicine | Admitting: Internal Medicine

## 2014-09-14 DIAGNOSIS — I272 Other secondary pulmonary hypertension: Secondary | ICD-10-CM | POA: Insufficient documentation

## 2014-09-14 LAB — PULMONARY FUNCTION TEST
DL/VA % PRED: 56 %
DL/VA: 2.66 ml/min/mmHg/L
DLCO unc % pred: 31 %
DLCO unc: 11.13 ml/min/mmHg
FEF 25-75 Post: 1.45 L/sec
FEF 25-75 Pre: 0.73 L/sec
FEF2575-%CHANGE-POST: 98 %
FEF2575-%PRED-POST: 72 %
FEF2575-%PRED-PRE: 36 %
FEV1-%CHANGE-POST: 22 %
FEV1-%PRED-POST: 39 %
FEV1-%PRED-PRE: 32 %
FEV1-PRE: 0.98 L
FEV1-Post: 1.2 L
FEV1FVC-%Change-Post: 4 %
FEV1FVC-%PRED-PRE: 103 %
FEV6-%Change-Post: 21 %
FEV6-%PRED-POST: 39 %
FEV6-%Pred-Pre: 32 %
FEV6-Post: 1.56 L
FEV6-Pre: 1.29 L
FEV6FVC-%Change-Post: 2 %
FEV6FVC-%PRED-PRE: 105 %
FEV6FVC-%Pred-Post: 107 %
FVC-%CHANGE-POST: 17 %
FVC-%PRED-PRE: 31 %
FVC-%Pred-Post: 36 %
FVC-POST: 1.56 L
FVC-Pre: 1.33 L
POST FEV1/FVC RATIO: 77 %
Post FEV6/FVC ratio: 100 %
Pre FEV1/FVC ratio: 74 %
Pre FEV6/FVC Ratio: 98 %
RV % pred: 80 %
RV: 2.28 L
TLC % pred: 58 %
TLC: 4.37 L

## 2014-09-14 MED ORDER — ALBUTEROL SULFATE (2.5 MG/3ML) 0.083% IN NEBU
2.5000 mg | INHALATION_SOLUTION | Freq: Once | RESPIRATORY_TRACT | Status: AC
  Administered 2014-09-14: 2.5 mg via RESPIRATORY_TRACT

## 2014-09-15 ENCOUNTER — Ambulatory Visit (HOSPITAL_COMMUNITY): Payer: 59

## 2014-09-20 ENCOUNTER — Ambulatory Visit (HOSPITAL_COMMUNITY)
Admission: RE | Admit: 2014-09-20 | Discharge: 2014-09-20 | Disposition: A | Discharge status: Home / Self Care | Payer: Medicare Other | Source: Ambulatory Visit | Attending: Internal Medicine | Admitting: Internal Medicine

## 2014-09-20 DIAGNOSIS — I509 Heart failure, unspecified: Secondary | ICD-10-CM | POA: Diagnosis not present

## 2014-09-20 DIAGNOSIS — N183 Chronic kidney disease, stage 3 (moderate): Secondary | ICD-10-CM | POA: Insufficient documentation

## 2014-09-20 DIAGNOSIS — R0602 Shortness of breath: Secondary | ICD-10-CM | POA: Diagnosis not present

## 2014-09-20 DIAGNOSIS — J90 Pleural effusion, not elsewhere classified: Secondary | ICD-10-CM | POA: Insufficient documentation

## 2014-09-20 DIAGNOSIS — I272 Pulmonary hypertension, unspecified: Secondary | ICD-10-CM

## 2014-09-20 MED ORDER — TECHNETIUM TO 99M ALBUMIN AGGREGATED
6.0000 | Freq: Once | INTRAVENOUS | Status: AC | PRN
  Administered 2014-09-20: 6 via INTRAVENOUS

## 2014-09-20 MED ORDER — TECHNETIUM TC 99M DIETHYLENETRIAME-PENTAACETIC ACID: 40.0000 | Freq: Once | INTRAVENOUS | Status: AC | PRN

## 2014-09-25 ENCOUNTER — Ambulatory Visit (INDEPENDENT_AMBULATORY_CARE_PROVIDER_SITE_OTHER): Payer: Medicare Other | Admitting: Interventional Cardiology

## 2014-09-25 ENCOUNTER — Encounter: Payer: Self-pay | Admitting: Interventional Cardiology

## 2014-09-25 VITALS — BP 130/84 | HR 80 | Ht 72.0 in | Wt 211.1 lb

## 2014-09-25 DIAGNOSIS — I10 Essential (primary) hypertension: Secondary | ICD-10-CM

## 2014-09-25 DIAGNOSIS — I482 Chronic atrial fibrillation, unspecified: Secondary | ICD-10-CM

## 2014-09-25 DIAGNOSIS — I270 Primary pulmonary hypertension: Secondary | ICD-10-CM

## 2014-09-25 DIAGNOSIS — I272 Pulmonary hypertension, unspecified: Secondary | ICD-10-CM

## 2014-09-25 NOTE — Patient Instructions (Signed)
Your physician recommends that you continue on your current medications as directed. Please refer to the Current Medication list given to you today.  Your physician recommends that you schedule a follow-up as needed with Dr. Eldridge DaceVaranasi.

## 2014-09-25 NOTE — Progress Notes (Signed)
Patient ID: Jason Ritter, male   DOB: [birth year], 80 y.o.   MRN: [id number] Patient ID: Jason Ritter, male   DOB: [birth year], 80 y.o.   MRN: [id number]    [address] [place], [place]  [address] Phone: [phone]) [phone] Fax:  [phone]) [phone]  Date:  09/25/2014   ID:  Jason Ritter, DOB [birth year], MRN [id number]  PCP:  Pearla DubonnetGATES,ROBERT NEVILL, MD      History of Present Illness: Jason Ritter is a 80 y.o. male with known diastolic dysfunction and atrial fibrillation.  He is concerned about his recent echo findings.  He had severe pulmonary HTN and RV dysfunction.  He still has DOE.  Walking around the house is ok and walking through the grocery store is ok.  He is limiting his travelling due to trouble walking long distances.    He has LE swelling.  Better with compression stockings.  Started oxygen at home.  Will be seeing Dr. Kendrick FriesMcQuaid.     Wt Readings from Last 3 Encounters:  09/25/14 211 lb 1.9 oz (95.763 kg)  09/05/14 207 lb (93.895 kg)  08/21/14 204 lb 1.9 oz (92.588 kg)     Past Medical History  Diagnosis Date  . CHF (congestive heart failure)   . Afib   . Shingles   . Pulmonary embolism   . Hyperlipidemia   . Glaucoma   . Hypothyroidism   . Hypertension     Current Outpatient Prescriptions  Medication Sig Dispense Refill  . AZOPT 1 % ophthalmic suspension Place 1 drop into the right eye 3 (three) times daily.     . furosemide (LASIX) 80 MG tablet Take 1 tablet (80 mg total) by mouth 2 (two) times daily. 60 tablet 3  . levothyroxine (SYNTHROID, LEVOTHROID) 112 MCG tablet Take 112 mcg by mouth daily.      [name]. lisinopril (PRINIVIL,ZESTRIL) 5 MG tablet TAKE 1 TABLET ONCE DAILY. 90 tablet 1  . LUMIGAN 0.01 % SOLN Place 1 drop into the right eye at bedtime.     . metoprolol succinate (TOPROL-XL) 100 MG 24 hr tablet TAKE 1 TABLET DAILY. 90 tablet 1  . temazepam (RESTORIL) 30 MG capsule Take 30 mg by mouth at bedtime as needed. For sleep.     [name]. warfarin (COUMADIN) 4 MG  tablet Take 4 mg by mouth as directed. Patient takes 4 mg daily Tuesday through Sunday and 2 mg on Monday. Last INR in clinic 2.4.      No current facility-administered medications for this visit.    Allergies:   No Known Allergies  Social History:  The patient  reports that he has never smoked. He does not have any smokeless tobacco history on file. He reports that he drinks about 4.2 oz of alcohol per week. He reports that he does not use illicit drugs.   Family History:  The patient's family history includes Cancer in his sister; Heart failure in his father; Stroke in his mother.   ROS:  Please see the history of present illness.  No nausea, vomiting.  No fevers, chills.  No focal weakness.  No dysuria. Fatigue, DOE as noted above. Edema.   All other systems reviewed and negative.   PHYSICAL EXAM: VS:  BP 130/84 mmHg  Pulse 80  Ht 6' (1.829 m)  Wt 211 lb 1.9 oz (95.763 kg)  BMI 28.63 kg/m2 General: Well developed, well nourished, in no acute distress HEENT: normal  Neck: no JVD , no carotid bruits  Cardiac: normal S1, S2; Irregularly irregular  Lungs: clear to auscultation bilaterally, no wheezing, rhonchi or rales  Abd: soft, nontender, no hepatomegaly  Ext: moderate bilateral pitting edema  Skin: warm and dry  Neuro: no focal abnormalities noted Psych: normal affect  EKG:  AFib, RBBB  - no change from prior.  ASSESSMENT AND PLAN:  1) Atrial fibrillation, persistent  Notes: Coumadin for stroke prevention.  Rate controlled.  2. Essential hypertension, benign  Notes: Controlled today. BP has been controlled at other office 3. Hypercholesteremia  Continue Atorvastatin Calcium, 10 MG, 1 tablet, once a day Notes: Lipids well controlled. He does not need his lipids checked in July. May 2014 LDL 56, HDL 57. June 2015, LDL 77. 4. Diastolic CHF /Cor pulmonale Continue Furosemide Tablet, 40 MG, 2 tablets in the AM, one -two  tabs in the PM ( 3 x/week on the higher  dose). Notes: Seems euvolemic- but echo should. Cr. 1.3 at last check in June 2015.  Balancing diuresis with CRI. Given the pleural effusion, will increase PM lasix to 80 mg daily for a week.  THen return to prior dose. 5. Anticoagulant monitoring  Notes: INR to be checked in Coumadin dose to be adjusted with Dr. Kevan NyGates. Low AT III by prior hypercoag w/u at time of PE years ago.  6. Pulmonary hypertension: He is quite hypoxic and needs to have 24 hour oxygen.  He wil be seeing Dr. Kendrick FriesMcQuaid.   7. Edema: from cor pulmonale; continue to use stockings and diuretics.  Signed, Fredric MareJay S. Zayquan Bogard, MD, [facility]. Francis HospitalFACC 09/25/2014 2:22 PM

## 2014-10-02 ENCOUNTER — Ambulatory Visit (INDEPENDENT_AMBULATORY_CARE_PROVIDER_SITE_OTHER): Payer: Medicare Other | Admitting: Pulmonary Disease

## 2014-10-02 ENCOUNTER — Encounter: Payer: Self-pay | Admitting: Pulmonary Disease

## 2014-10-02 VITALS — BP 134/76 | HR 86

## 2014-10-02 DIAGNOSIS — J948 Other specified pleural conditions: Secondary | ICD-10-CM

## 2014-10-02 DIAGNOSIS — J90 Pleural effusion, not elsewhere classified: Secondary | ICD-10-CM

## 2014-10-02 DIAGNOSIS — I270 Primary pulmonary hypertension: Secondary | ICD-10-CM

## 2014-10-02 DIAGNOSIS — I272 Pulmonary hypertension, unspecified: Secondary | ICD-10-CM

## 2014-10-02 NOTE — Assessment & Plan Note (Signed)
This has been present for nearly 7 years. I suspect it is related to his chronic diastolic heart failure, and now at this point his pulmonary hypertension. Effusions which have been present this long are notoriously difficult to drain and the likelihood of entrapped lung is very high. At this point I do not recommend a thoracentesis as this would likely cause more harm than good considering the fact that he is fairly frail and elderly.

## 2014-10-02 NOTE — Progress Notes (Signed)
Subjective:    Patient ID: Jason Ritter, male    DOB: 07/28/1929, 80 y.o.   MRN: [id number]  HPI This is a very pleasant 80 year old male who comes to my clinic today for evaluation of possible pulmonary disease in the setting of a new diagnosis of pulmonary hypertension. 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 He did not work in the lower part of the ship and did not have significant asbestos exposure to his knowledge. 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  In 2006 he had a pulmonary embolism after a long trip to [place]. He was hospitalized for that and was placed on anticoagulation with warfarin. When he returned to [place] he and his primary care doctor decided to treat him indefinitely with warfarin to reduce his longtime risk of recurrent pulmonary embolism. There is a question as to whether or not he had anti-thrombin 3 deficiency, but it should be noted that this test was collected in the midst of his acute pulmonary embolism. He noted some shortness of breath at this time with his daily walks and so he gradually decreased his regular exercise routine. Over the years since then his shortness of breath has gradually worsened and for several years now he has not been participating in regular exercise. He says that he pretty much just stays around the house and does not have much shortness of breath with minimal activity at home. In the last year he's had progressive leg swelling.   Apparently because of the leg swelling he was referred for a repeat echocardiogram. This showed severe dilatation of the right ventricle as well as pulmonary hypertension so he was referred to my colleagues in the cardiology clinic for further evaluation.  Past Medical History  Diagnosis Date  . CHF (congestive heart failure)   . Afib   . Shingles   . Pulmonary embolism   . Hyperlipidemia   . Glaucoma   . Hypothyroidism   .  Hypertension      Family History  Problem Relation Age of Onset  . Stroke Mother   . Heart failure Father   . Cancer Sister      History   Social History  . Marital Status: Married    Spouse Name: N/A    Number of Children: N/A  . Years of Education: N/A   Occupational History  . Not on file.   Social History Main Topics  . Smoking status: Never Smoker   . Smokeless tobacco: Never Used  . Alcohol Use: 4.2 oz/week    7 Shots of liquor per week     Comment: nightly  Martini  . Drug Use: No  . Sexual Activity: No   Other Topics Concern  . Not on file   Social History Narrative     No Known Allergies   Outpatient Prescriptions Prior to Visit  Medication Sig Dispense Refill  . AZOPT 1 % ophthalmic suspension Place 1 drop into the right eye 2 (two) times daily.     . furosemide (LASIX) 80 MG tablet Take 1 tablet (80 mg total) by mouth 2 (two) times daily. 60 tablet 3  . levothyroxine (SYNTHROID, LEVOTHROID) 112 MCG tablet Take 112 mcg by mouth daily.      [name]. lisinopril (PRINIVIL,ZESTRIL) 5 MG tablet TAKE 1 TABLET ONCE DAILY. 90 tablet 1  . LUMIGAN 0.01 % SOLN Place 1 drop into  the right eye at bedtime.     . metoprolol succinate (TOPROL-XL) 100 MG 24 hr tablet TAKE 1 TABLET DAILY. 90 tablet 1  . temazepam (RESTORIL) 30 MG capsule Take 30 mg by mouth at bedtime as needed. For sleep.     [name]. warfarin (COUMADIN) 4 MG tablet Take 4 mg by mouth as directed. Patient takes 4 mg daily Tuesday through Sunday and 2 mg on Monday. Last INR in clinic 2.4.      No facility-administered medications prior to visit.       Review of Systems  Constitutional: Negative for fever and unexpected weight change.  HENT: Negative for congestion, dental problem, ear pain, nosebleeds, postnasal drip, rhinorrhea, sinus pressure, sneezing, sore throat and trouble swallowing.   Eyes: Negative for redness and itching.  Respiratory: Positive for shortness of breath. Negative for cough, chest  tightness and wheezing.   Cardiovascular: Negative for palpitations and leg swelling.  Gastrointestinal: Negative for nausea and vomiting.  Genitourinary: Negative for dysuria.  Musculoskeletal: Negative for joint swelling.  Skin: Negative for rash.  Neurological: Negative for headaches.  Hematological: Does not bruise/bleed easily.  Psychiatric/Behavioral: Negative for dysphoric mood. The patient is not nervous/anxious.        Objective:   Physical Exam Filed Vitals:   10/02/14 1520  BP: 134/76  Pulse: 86  SpO2: 90%  RA  Ambulated 100 feet on room air and his O2 saturation dropped to 86%, improved with 3 L exertion  Gen: Frail, chronically ill-appearing appearing, no acute distress in wheelchair HEENT: NCAT, PERRL, EOMi, OP clear, neck supple without masses PULM: Diminished left base otherwise clear CV: Irregularly irregular, slight systolic murmur, no JVD AB: BS+, soft, nontender, no hsm Ext: warm, massive lower leg edema bilaterally, no clubbing, no cyanosis Derm: no rash or skin breakdown Neuro: A&Ox4, CN II-XII intact, strength 5/5 in all 4 extremities  2015 pulmonary function test > ratio 77%, FEV1 1.20 L (39% predicted), total lung capacity 4.37 L (58% predicted), DLCO 11.13 (31% predicted) 2015 CT chest> (I have personally reviewed these images ) chronic left-sided pleural effusion again noted, multiple nodules in the right lung both pleural-based and basilar in nature, some are calcified, all are well-circumscribed and round suggestive of chronic granulomatous disease, no other clear pulmonary parenchymal abnormality identified I reviewed Dr. Kandis MannanBensimohn's notes in detail 2015 VQ scan> low risk for pulmonary embolism, chronic left pleural effusion noted     Assessment & Plan:   Pulmonary hypertension I explained to Jason Ritter today that I do not have a clear reason for his pulmonary hypertension. I have reviewed the results of the lung function test and they show  restriction with disproportionate decrease in his DLCO. Though I have never seen it in the past, I do wonder whether or not to chronic pleural effusion has contributed to some degree to this. It sounds as if this pleural fluid has been present since 2009, and on all chest x-rays I have reviewed going back to [id number] it is certainly present. Classically, pleural effusions are not associated with hypoxemia, but I wonder if there is some combination of restriction and VQ mismatch which has contributed to his pulmonary hypertension considering the fact that this pleural fluid has been present for nearly 7 years at this point.  I have reviewed the images of his CT chest as well as the pulmonary function test. There is no clear airflow obstruction and his pulmonary parenchyma is fairly clear. There is some mild scarring in the  right lung which is suggestive of chronic granulomatous disease, but this is more in keeping with his history of living in [place] (likely old histoplasmosis) and this finding is not characteristic of a systemic or diffuse pulmonary parenchymal process.  So to summarize, it is not clear to me that he has a pulmonary parenchymal process (true WHO group 3 disease) which is the etiology of his pulmonary hypertension. I suspect the chronic pleural effusion is at play to some degree. However, pulmonary hypertension has become increasingly diagnosed in the octogenarian population based on clinical studies relying on ICD-9 codes.  This does not mean that there is necessarily an increase in WHO group 1 disease, and likely reflects the natural history of aging in people with chronic vascular disease.   In his particular case I do not feel that there is a clear benefit for performing a right heart catheterization or considering a pulmonary vasodilator considering his increased age, and his very limited functional status.  Plan: -adjust diuretics per cardiology -Continue oxygen with  exertion -Prescribed portable oxygen in the form of liquid oxygen -Check overnight oximetry test -Follow-up 4-6 weeks to see how things are going   Pleural effusion on left  This has been present for nearly 7 years. I suspect it is related to his chronic diastolic heart failure, and now at this point his pulmonary hypertension. Effusions which have been present this long are notoriously difficult to drain and the likelihood of entrapped lung is very high. At this point I do not recommend a thoracentesis as this would likely cause more harm than good considering the fact that he is fairly frail and elderly.   Updated Medication List Outpatient Encounter Prescriptions as of 10/02/2014  Medication Sig  . aspirin 81 MG chewable tablet Chew 81 mg by mouth daily.  . AZOPT 1 % ophthalmic suspension Place 1 drop into the right eye 2 (two) times daily.   . furosemide (LASIX) 80 MG tablet Take 1 tablet (80 mg total) by mouth 2 (two) times daily.  [name] levothyroxine (SYNTHROID, LEVOTHROID) 112 MCG tablet Take 112 mcg by mouth daily.    [name] lisinopril (PRINIVIL,ZESTRIL) 5 MG tablet TAKE 1 TABLET ONCE DAILY.  [name] LUMIGAN 0.01 % SOLN Place 1 drop into the right eye at bedtime.   . metoprolol succinate (TOPROL-XL) 100 MG 24 hr tablet TAKE 1 TABLET DAILY.  [name] temazepam (RESTORIL) 30 MG capsule Take 30 mg by mouth at bedtime as needed. For sleep.   [name] warfarin (COUMADIN) 4 MG tablet Take 4 mg by mouth as directed. Patient takes 4 mg daily Tuesday through Sunday and 2 mg on Monday. Last INR in clinic 2.4.

## 2014-10-02 NOTE — Patient Instructions (Signed)
We will schedule you for an overnight oxygen test. We will get you set up with liquid oxygen. Keep using your oxygen at night and with exertion in the meantime.   We will see you back in 4 weeks. (30 MINUTE APPOINTMENT)

## 2014-10-02 NOTE — Assessment & Plan Note (Addendum)
I explained to Mr. Jason Ritter today that I do not have a clear reason for his pulmonary hypertension. I have reviewed the results of the lung function test and they show restriction with disproportionate decrease in his DLCO. Though I have never seen it in the past, I do wonder whether or not to chronic pleural effusion has contributed to some degree to this. It sounds as if this pleural fluid has been present since 2009, and on all chest x-rays I have reviewed going back to [id number] it is certainly present. Classically, pleural effusions are not associated with hypoxemia, but I wonder if there is some combination of restriction and VQ mismatch which has contributed to his pulmonary hypertension considering the fact that this pleural fluid has been present for nearly 7 years at this point.  I have reviewed the images of his CT chest as well as the pulmonary function test. There is no clear airflow obstruction and his pulmonary parenchyma is fairly clear. There is some mild scarring in the right lung which is suggestive of chronic granulomatous disease, but this is more in keeping with his history of living in [place] (likely old histoplasmosis) and this finding is not characteristic of a systemic or diffuse pulmonary parenchymal process.  So to summarize, it is not clear to me that he has a pulmonary parenchymal process (true WHO group 3 disease) which is the etiology of his pulmonary hypertension. I suspect the chronic pleural effusion is at play to some degree. However, pulmonary hypertension has become increasingly diagnosed in the octogenarian population based on clinical studies relying on ICD-9 codes.  This does not mean that there is necessarily an increase in WHO group 1 disease, and likely reflects the natural history of aging in people with chronic vascular disease.   In his particular case I do not feel that there is a clear benefit for performing a right heart catheterization or considering a pulmonary  vasodilator considering his increased age, and his very limited functional status.  Plan: -adjust diuretics per cardiology -Continue oxygen with exertion -Prescribed portable oxygen in the form of liquid oxygen -Check overnight oximetry test -Follow-up 4-6 weeks to see how things are going

## 2014-10-03 ENCOUNTER — Telehealth: Payer: Self-pay | Admitting: Pulmonary Disease

## 2014-10-03 NOTE — Telephone Encounter (Signed)
Spoke with Melissa, states that [facility] is tEfraim Kaufmannrying to move away from liquid 02, so all orders have to be approved by their Senior Leadership team.  The spearhead for that team is Michael BostonWendy Parrish, who is out on vacation until next week.  They will review the request for liquid 02 and let us know the outcome of that review after next week.  Melissa has already spoken to the pt to make them aware, they are ok with this.  Will still proceed with the ONO as planned.  Melissa just wanted to let us know.  Nothing for us to do at this time to expedite the process.  FYI!!

## 2014-10-03 NOTE — Telephone Encounter (Signed)
OK 

## 2014-10-11 ENCOUNTER — Encounter (HOSPITAL_COMMUNITY): Payer: Self-pay

## 2014-10-11 ENCOUNTER — Ambulatory Visit (HOSPITAL_COMMUNITY)
Admission: RE | Admit: 2014-10-11 | Discharge: 2014-10-11 | Disposition: A | Discharge status: Home / Self Care | Payer: Medicare Other | Source: Ambulatory Visit | Attending: Cardiology | Admitting: Cardiology

## 2014-10-11 VITALS — BP 111/71 | HR 88 | Resp 18 | Wt 208.5 lb

## 2014-10-11 DIAGNOSIS — Z9981 Dependence on supplemental oxygen: Secondary | ICD-10-CM | POA: Insufficient documentation

## 2014-10-11 DIAGNOSIS — Z86711 Personal history of pulmonary embolism: Secondary | ICD-10-CM | POA: Diagnosis not present

## 2014-10-11 DIAGNOSIS — J948 Other specified pleural conditions: Secondary | ICD-10-CM

## 2014-10-11 DIAGNOSIS — J8410 Pulmonary fibrosis, unspecified: Secondary | ICD-10-CM | POA: Diagnosis not present

## 2014-10-11 DIAGNOSIS — I272 Other secondary pulmonary hypertension: Secondary | ICD-10-CM | POA: Diagnosis not present

## 2014-10-11 DIAGNOSIS — I482 Chronic atrial fibrillation, unspecified: Secondary | ICD-10-CM

## 2014-10-11 DIAGNOSIS — J9610 Chronic respiratory failure, unspecified whether with hypoxia or hypercapnia: Secondary | ICD-10-CM | POA: Diagnosis not present

## 2014-10-11 DIAGNOSIS — Z7982 Long term (current) use of aspirin: Secondary | ICD-10-CM | POA: Insufficient documentation

## 2014-10-11 DIAGNOSIS — I129 Hypertensive chronic kidney disease with stage 1 through stage 4 chronic kidney disease, or unspecified chronic kidney disease: Secondary | ICD-10-CM | POA: Insufficient documentation

## 2014-10-11 DIAGNOSIS — I517 Cardiomegaly: Secondary | ICD-10-CM | POA: Diagnosis not present

## 2014-10-11 DIAGNOSIS — I2510 Atherosclerotic heart disease of native coronary artery without angina pectoris: Secondary | ICD-10-CM | POA: Diagnosis not present

## 2014-10-11 DIAGNOSIS — J9811 Atelectasis: Secondary | ICD-10-CM | POA: Diagnosis not present

## 2014-10-11 DIAGNOSIS — E039 Hypothyroidism, unspecified: Secondary | ICD-10-CM | POA: Insufficient documentation

## 2014-10-11 DIAGNOSIS — I2781 Cor pulmonale (chronic): Secondary | ICD-10-CM | POA: Diagnosis not present

## 2014-10-11 DIAGNOSIS — I5032 Chronic diastolic (congestive) heart failure: Secondary | ICD-10-CM | POA: Diagnosis present

## 2014-10-11 DIAGNOSIS — E785 Hyperlipidemia, unspecified: Secondary | ICD-10-CM | POA: Diagnosis not present

## 2014-10-11 DIAGNOSIS — N183 Chronic kidney disease, stage 3 (moderate): Secondary | ICD-10-CM | POA: Insufficient documentation

## 2014-10-11 DIAGNOSIS — Z79899 Other long term (current) drug therapy: Secondary | ICD-10-CM | POA: Insufficient documentation

## 2014-10-11 DIAGNOSIS — Z7901 Long term (current) use of anticoagulants: Secondary | ICD-10-CM | POA: Diagnosis not present

## 2014-10-11 DIAGNOSIS — I270 Primary pulmonary hypertension: Secondary | ICD-10-CM

## 2014-10-11 DIAGNOSIS — J90 Pleural effusion, not elsewhere classified: Secondary | ICD-10-CM

## 2014-10-11 LAB — BASIC METABOLIC PANEL
Anion gap: 8 (ref 5–15)
BUN: 24 mg/dL — ABNORMAL HIGH (ref 6–23)
CALCIUM: 9.4 mg/dL (ref 8.4–10.5)
CO2: 38 mmol/L — ABNORMAL HIGH (ref 19–32)
Chloride: 93 mEq/L — ABNORMAL LOW (ref 96–112)
Creatinine, Ser: 1.2 mg/dL (ref 0.50–1.35)
GFR calc non Af Amer: 54 mL/min — ABNORMAL LOW (ref >90)
GFR, EST AFRICAN AMERICAN: 63 mL/min — AB (ref >90)
Glucose, Bld: 129 mg/dL — ABNORMAL HIGH (ref 70–99)
POTASSIUM: 4.2 mmol/L (ref 3.5–5.1)
SODIUM: 139 mmol/L (ref 135–145)

## 2014-10-11 LAB — BRAIN NATRIURETIC PEPTIDE: B Natriuretic Peptide: 293.9 pg/mL — ABNORMAL HIGH (ref 0.0–100.0)

## 2014-10-11 MED ORDER — TORSEMIDE 20 MG PO TABS: 60.0000 mg | ORAL_TABLET | Freq: Every day | ORAL | Status: DC

## 2014-10-11 NOTE — Patient Instructions (Signed)
STOP Lasix (furosemide) START Demadex (torsemide) 60 mg daily  Labs today  Your physician recommends that you schedule a follow-up appointment in: 2 weeks with labs (bmet, bnp)  Do the following things EVERYDAY: 1) Weigh yourself in the morning before breakfast. Write it down and keep it in a log. 2) Take your medicines as prescribed 3) Eat low salt foods-Limit salt (sodium) to 2000 mg per day.  4) Stay as active as you can everyday 5) Limit all fluids for the day to less than 2 liters 6)

## 2014-10-11 NOTE — Progress Notes (Signed)
Patient ID: Jason SprayJames N Bookwalter, male   DOB: 1929-03-27, 80 y.o.   MRN: [id number]   Date:  10/11/2014  ID:  Jason SprayJames N Guarino, DOB 1929-03-27, MRN [id number] PCP:  Pearla DubonnetGATES,ROBERT NEVILL, MD     History of Present Illness: Jason Ritter is a 80 y.o. 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  He has been followed by Dr. Eldridge DaceVaranasi. He was referred for further evaluation of pulmonary HTN on recent echo.   Had PE in 2006 and found to have ATIII deficiency. Took coumadin. In 2012 found to have diastolic HF.  Echo 4/12 RVSP 46 mmHG. Developed left pleural effusion at that time (2012). Did not have thoracentesis. Last chest CT in 2013 as below with moderate to large left pleural effusion and granulomatous disease. No PE.   Myoview 5/12. ? Mild reversible defect in mid-anterolateral wall EF 65%. Managed medically.   Currently able to get around the house and do ADLs without difficulty. Can walk in store but gets weak and tired in his legs. Gets fatigued after walking 40 yards. + severe edema. Denies chest pain, palpitations, syncope, fall, shortness breath at rest, orthopnea, PND. No bleeding problems. Weight stable. Currently taking Lasix 80 mg bid. Oxygen saturation 88% today on room air. He is wearing oxygen continuously typically.   Dr. Kevan NyGates saw him recently and obtained echo. There was severe pulmonary HTN and RV dysfunction. He was referred here for further evaluation.    Echo 10/15:  - Left ventricle: The cavity size was normal. There was mild focal basal hypertrophy of the septum. Systolic function was normal. The estimated ejection fraction was in the range of 55% to 60%. Wall motion was normal; there were no regional wall motion abnormalities. - Ventricular septum: The contour showed diastolic flattening and systolic flattening. - Aortic root: The aortic root was mildly  dilated. - Left atrium: The atrium was mildly dilated. - Right ventricle: The cavity size was severely dilated. Systolic function was severely reduced. - Right atrium: The atrium was severely dilated. - Pulmonary arteries: Systolic pressure was severely increased. PA peak pressure: 83 mm Hg (S). - Pericardium, extracardiac: There was a left pleural effusion.  Chest CT 3/13:  1. No PE 2. Little change in moderate to large left pleural effusion with dense passive atelectasis of the part of the left lower lobe and lingula. 3. Stable primarily right lung nodules. No definite new or enlarging nodule. 4. Slight irregularity of the mucosa of the mid distal esophagus. Consider barium swallow to assess further. 5. Cardiomegaly and coronary artery calcifications. 6. Calcified granulomas and right hilar nodes consistent with prior granulomatous disease.  After initial appointment, he had CXR done showing chronic R>L pleural effusions.  PFTs were interpreted as showing severe obstructive airways disease, severe restriction, and severe diffusion defect.  V/Q scan in 12/15 was low probability for PE.    He was referred to pulmonary (Dr Kendrick FriesMcQuaid) for further evaluation of lung disease and possible Group 3 pulmonary hypertension.  Dr Kendrick FriesMcQuaid reviewed PFTs and prior CT.  He thought that there was no clear airflow obstruction and that the parenchyma was fairly clear.  There was mild scarring on the CT suggestive of granulomatous disease that may be prior histoplasmosis given residence in [place] in the past.  He did not feel that there was clear evidence for group 3 pulmonary hypertension.  The chronic pleural effusions may play a  role via mechanical compression.  He thought that there would probably be no significant benefit from RHC and pulmonary vasodilators given age and limited function. He thought that the pleural effusion would be hard to drain and would risk entrapped lung, could do more  harm than good to try thoracentesis.   Labs  3/14: K 4.6 Creatinine 1.50 Labs  5/14: K 3.8 Creatinine 1.24   Wt Readings from Last 3 Encounters:  10/11/14 208 lb 8 oz (94.575 kg)  09/25/14 211 lb 1.9 oz (95.763 kg)  09/05/14 207 lb (93.895 kg)     Past Medical History  Diagnosis Date  . CHF (congestive heart failure)   . Afib   . Shingles   . Pulmonary embolism   . Hyperlipidemia   . Glaucoma   . Hypothyroidism   . Hypertension     Current Outpatient Prescriptions  Medication Sig Dispense Refill  . aspirin 81 MG chewable tablet Chew 81 mg by mouth daily.    . AZOPT 1 % ophthalmic suspension Place 1 drop into the right eye 2 (two) times daily.     [name] levothyroxine (SYNTHROID, LEVOTHROID) 112 MCG tablet Take 112 mcg by mouth daily.      [name] lisinopril (PRINIVIL,ZESTRIL) 5 MG tablet TAKE 1 TABLET ONCE DAILY. 90 tablet 1  . LUMIGAN 0.01 % SOLN Place 1 drop into the right eye at bedtime.     . metoprolol succinate (TOPROL-XL) 100 MG 24 hr tablet TAKE 1 TABLET DAILY. 90 tablet 1  . temazepam (RESTORIL) 30 MG capsule Take 30 mg by mouth at bedtime as needed. For sleep.     [name] warfarin (COUMADIN) 4 MG tablet Take 4 mg by mouth as directed. Patient takes 4 mg daily Tuesday through Sunday and 2 mg on Monday. Last INR in clinic 2.4.     [name] torsemide (DEMADEX) 20 MG tablet Take 3 tablets (60 mg total) by mouth daily. 270 tablet 3   No current facility-administered medications for this encounter.    Allergies:   No Known Allergies  Social History:  The patient  reports that he has never smoked. He has never used smokeless tobacco. He reports that he drinks about 4.2 oz of alcohol per week. He reports that he does not use illicit drugs.   Family History:  The patient's family history includes Cancer in his sister; Heart failure in his father; Stroke in his mother.   ROS:  Please see the history of present illness.  No nausea, vomiting.  No fevers, chills.  No focal weakness.  No dysuria.  Fatigue, DOE as noted above. Edema.   All other systems reviewed and negative.   PHYSICAL EXAM: VS:  BP 111/71 mmHg  Pulse 88  Resp 18  Wt 208 lb 8 oz (94.575 kg)  SpO2 88% RA General: Elderly frail male sitting in WC HEENT: normal  Neck: JVP 8-9 cm, no carotid bruits Cardiac: normal S1, S2; Irregularly irregular  Loud P2 + RV lift. Widely split s2, 2/6 HSM LLSB Lungs: decreased breath sounds throughout worse in LLL. No wheezing Abd: soft, nontender, no hepatomegaly  Ext: 3+ edema R>>L distal cyanosis and venous stasis changes Skin: warm and dry  Neuro: no focal abnormalities noted Psych: normal affect  ASSESSMENT AND PLAN:  1) Severe pulmonary HTN with cor pulmonale: Etiology remains somewhat uncertain.  V/Q scan showed no PE.  PFTs were abnormal but were reviewed by Dr Kendrick Fries and not thought to show significant obstruction or evidence for interstitial lung  disease.  Evidence for granulomatous disease was not extensive and was thought to be possibly due to histoplasmosis given time living in [place]. It is possible that chronic bilateral pleural effusions may play a role in pulmonary hypertension via mechanical compression.  Also, cannot rule out component of pulmonary venous hypertension.  Not definite group 3 pulmonary hypertension based on pulmonary evaluation.   - I agree with Dr Kendrick FriesMcQuaid that RHC and pulmonary vasodilators are not likely to help much in this situation given advanced cor pulmonale, poor functional status, and advanced age.   2) Chronic respiratory failure: Etiology uncertain.  There is not much evidence for lung parenchymal disease (see Dr Ulyses JarredMcQuaid's last note).  - He should continue oxygen at all times and is planned for overnight oximetry.  3) Left>right pleural effusion chronically: Since at least 2012.  This was reviewed by Dr Kendrick FriesMcQuaid who is hesitant to do thoracentesis given chronic effusions and risk of entrapped lung with thoracentesis.  Thinks this may do  more harm that good.  Patient continues to want a thoracentesis done. I told him to discuss again with Dr Kendrick FriesMcQuaid when he follows up in January.  4) h/o pulmonary embolism 2006: On chronic warfarin.  5) Atrial fibrillation, persistent. He is on warfarin, rate is reasonably controlled on Toprol XL.  6) Chronic diastolic HF with cor pulmonale: Patient continues to have significant volume overload, probably mainly RV failure.   - BMET/BNP today.  - Stop Lasix, start torsemide 60 mg daily.   - Followup with BMET in 2 wks.  Will adjust diuretic as needed at that time.  7) Granulomatous disease on chest CT: Possible histoplasmosis given prior residence in [place].  I spent 45 minutes discussing the results of the patient's testing with him and talking with him about the next steps in his treatment.   Aboubacar Matsuo,MD 10/11/2014

## 2014-10-24 ENCOUNTER — Ambulatory Visit (HOSPITAL_COMMUNITY)
Admission: RE | Admit: 2014-10-24 | Discharge: 2014-10-24 | Disposition: A | Discharge status: Home / Self Care | Payer: Medicare Other | Source: Ambulatory Visit | Attending: Cardiology | Admitting: Cardiology

## 2014-10-24 ENCOUNTER — Encounter (HOSPITAL_COMMUNITY): Payer: Self-pay

## 2014-10-24 VITALS — BP 132/82 | HR 80 | Resp 20 | Wt 204.2 lb

## 2014-10-24 DIAGNOSIS — J948 Other specified pleural conditions: Secondary | ICD-10-CM

## 2014-10-24 DIAGNOSIS — E039 Hypothyroidism, unspecified: Secondary | ICD-10-CM | POA: Insufficient documentation

## 2014-10-24 DIAGNOSIS — I272 Other secondary pulmonary hypertension: Secondary | ICD-10-CM | POA: Insufficient documentation

## 2014-10-24 DIAGNOSIS — I482 Chronic atrial fibrillation, unspecified: Secondary | ICD-10-CM

## 2014-10-24 DIAGNOSIS — J90 Pleural effusion, not elsewhere classified: Secondary | ICD-10-CM

## 2014-10-24 DIAGNOSIS — Z7901 Long term (current) use of anticoagulants: Secondary | ICD-10-CM | POA: Diagnosis not present

## 2014-10-24 DIAGNOSIS — I5032 Chronic diastolic (congestive) heart failure: Secondary | ICD-10-CM | POA: Diagnosis not present

## 2014-10-24 DIAGNOSIS — I5022 Chronic systolic (congestive) heart failure: Secondary | ICD-10-CM

## 2014-10-24 DIAGNOSIS — I481 Persistent atrial fibrillation: Secondary | ICD-10-CM | POA: Insufficient documentation

## 2014-10-24 DIAGNOSIS — Z79899 Other long term (current) drug therapy: Secondary | ICD-10-CM | POA: Diagnosis not present

## 2014-10-24 DIAGNOSIS — I129 Hypertensive chronic kidney disease with stage 1 through stage 4 chronic kidney disease, or unspecified chronic kidney disease: Secondary | ICD-10-CM | POA: Insufficient documentation

## 2014-10-24 DIAGNOSIS — E785 Hyperlipidemia, unspecified: Secondary | ICD-10-CM | POA: Diagnosis not present

## 2014-10-24 DIAGNOSIS — D71 Functional disorders of polymorphonuclear neutrophils: Secondary | ICD-10-CM | POA: Diagnosis not present

## 2014-10-24 DIAGNOSIS — Z86711 Personal history of pulmonary embolism: Secondary | ICD-10-CM | POA: Insufficient documentation

## 2014-10-24 DIAGNOSIS — I270 Primary pulmonary hypertension: Secondary | ICD-10-CM

## 2014-10-24 DIAGNOSIS — N183 Chronic kidney disease, stage 3 (moderate): Secondary | ICD-10-CM | POA: Diagnosis not present

## 2014-10-24 DIAGNOSIS — I2781 Cor pulmonale (chronic): Secondary | ICD-10-CM | POA: Insufficient documentation

## 2014-10-24 DIAGNOSIS — J9610 Chronic respiratory failure, unspecified whether with hypoxia or hypercapnia: Secondary | ICD-10-CM | POA: Insufficient documentation

## 2014-10-24 DIAGNOSIS — R0602 Shortness of breath: Secondary | ICD-10-CM

## 2014-10-24 LAB — BASIC METABOLIC PANEL
ANION GAP: 9 (ref 5–15)
BUN: 25 mg/dL — ABNORMAL HIGH (ref 6–23)
CALCIUM: 9.2 mg/dL (ref 8.4–10.5)
CO2: 31 mmol/L (ref 19–32)
Chloride: 98 mEq/L (ref 96–112)
Creatinine, Ser: 1.43 mg/dL — ABNORMAL HIGH (ref 0.50–1.35)
GFR calc Af Amer: 51 mL/min — ABNORMAL LOW (ref >90)
GFR, EST NON AFRICAN AMERICAN: 44 mL/min — AB (ref >90)
Glucose, Bld: 99 mg/dL (ref 70–99)
Potassium: 4.2 mmol/L (ref 3.5–5.1)
SODIUM: 138 mmol/L (ref 135–145)

## 2014-10-24 LAB — BRAIN NATRIURETIC PEPTIDE: B NATRIURETIC PEPTIDE 5: 350.3 pg/mL — AB (ref 0.0–100.0)

## 2014-10-24 MED ORDER — TORSEMIDE 20 MG PO TABS: 80.0000 mg | ORAL_TABLET | Freq: Every day | ORAL | Status: AC

## 2014-10-24 NOTE — Patient Instructions (Signed)
STOP aspirin.  INCREASE Torsemide to 80mg  once daily.  INCREASE Oxygen to 4L at rest, 5L with activity.  Follow up 2-3 weeks with repeat lab work.

## 2014-10-25 ENCOUNTER — Encounter: Payer: Self-pay | Admitting: Internal Medicine

## 2014-10-25 NOTE — Progress Notes (Signed)
Patient ID: Jason Ritter, male   DOB: Ritter 22, 1930, 81 y.o.   MRN: [id number]   Date:  10/25/2014  ID:  Jason Ritter, DOB 05-06-1929, MRN [id number] PCP:  Pearla Dubonnet, MD     History of Present Illness: Jason Ritter is a 81 y.o. 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  He has been followed by Dr. Eldridge Dace. He was referred for further evaluation of pulmonary HTN on recent echo.   Had PE in 2006 and found to have ATIII deficiency. Took coumadin. In 2012 found to have diastolic HF.  Echo 4/12 RVSP 46 mmHG. Developed left pleural effusion at that time (2012). Did not have thoracentesis. Last chest CT in 2013 as below with moderate to large left pleural effusion and granulomatous disease. No PE.   Myoview 5/12. ? Mild reversible defect in mid-anterolateral wall EF 65%. Managed medically.   Dr. Kevan Ny saw him recently and obtained echo. There was severe pulmonary HTN and RV dysfunction. He was referred here for further evaluation.    Echo 10/15:  - Left ventricle: The cavity size was normal. There was mild focal basal hypertrophy of the septum. Systolic function was normal. The estimated ejection fraction was in the range of 55% to 60%. Wall motion was normal; there were no regional wall motion abnormalities. - Ventricular septum: The contour showed diastolic flattening and systolic flattening. - Aortic root: The aortic root was mildly dilated. - Left atrium: The atrium was mildly dilated. - Right ventricle: The cavity size was severely dilated. Systolic function was severely reduced. - Right atrium: The atrium was severely dilated. - Pulmonary arteries: Systolic pressure was severely increased. PA peak pressure: 83 mm Hg (S). - Pericardium, extracardiac: There was a left pleural effusion.  Chest CT 3/13:  1. No PE 2. Little change in moderate  to large left pleural effusion with dense passive atelectasis of the part of the left lower lobe and lingula. 3. Stable primarily right lung nodules. No definite new or enlarging nodule. 4. Slight irregularity of the mucosa of the mid distal esophagus. Consider barium swallow to assess further. 5. Cardiomegaly and coronary artery calcifications. 6. Calcified granulomas and right hilar nodes consistent with prior granulomatous disease.  After initial appointment, he had CXR done showing chronic R>L pleural effusions.  PFTs were interpreted as showing severe obstructive airways disease, severe restriction, and severe diffusion defect.  V/Q scan in 12/15 was low probability for PE.    He was referred to pulmonary (Dr Kendrick Fries) for further evaluation of lung disease and possible Group 3 pulmonary hypertension.  Dr Kendrick Fries reviewed PFTs and prior CT.  He thought that there was no clear airflow obstruction and that the parenchyma was fairly clear.  There was mild scarring on the CT suggestive of granulomatous disease that may be prior histoplasmosis given residence in [place] in the past.  He did not feel that there was clear evidence for group 3 pulmonary hypertension.  The chronic pleural effusions may play a role via mechanical compression.  He thought that there would probably be no significant benefit from RHC and pulmonary vasodilators given age and limited function. He thought that the pleural effusion would be hard to drain and would risk entrapped lung, could do more harm than good to try thoracentesis.   Currently able to get around the house and do ADLs without difficulty. Can walk in store but gets weak  and tired in his legs. Gets fatigued after walking 40 yards. + severe edema. Denies chest pain, palpitations, syncope, fall, shortness breath at rest, orthopnea, PND. No bleeding problems. Weight stable. He was transitioned to torsemide at last appointment.  Weight is down 4 lbs but still  has a lot of edema.  Wearing oxygen at all times, oxygen saturation 86% when he walked in today.   Labs  3/14: K 4.6 Creatinine 1.50 Labs  5/14: K 3.8 Creatinine 1.24 Labs 12/15: K 4.2, creatinine 1.2, BNP 294   Wt Readings from Last 3 Encounters:  09/25/14 211 lb 1.9 oz (95.763 kg)  08/21/14 204 lb 1.9 oz (92.588 kg)  03/30/14 209 lb (94.802 kg)     Past Medical History  Diagnosis Date  . CHF (congestive heart failure)   . Afib   . Shingles   . Pulmonary embolism   . Hyperlipidemia   . Glaucoma   . Hypothyroidism   . Hypertension     Current Outpatient Prescriptions  Medication Sig Dispense Refill  . AZOPT 1 % ophthalmic suspension Place 1 drop into the right eye 2 (two) times daily.     [name]. docusate sodium (COLACE) 100 MG capsule Take 200 mg by mouth at bedtime.    [name]. levothyroxine (SYNTHROID, LEVOTHROID) 112 MCG tablet Take 112 mcg by mouth daily.      [name]. lisinopril (PRINIVIL,ZESTRIL) 5 MG tablet TAKE 1 TABLET ONCE DAILY. 90 tablet 1  . LUMIGAN 0.01 % SOLN Place 1 drop into the right eye at bedtime.     . Melatonin 3 MG CAPS Take 3 mg by mouth at bedtime.    . metoprolol succinate (TOPROL-XL) 100 MG 24 hr tablet TAKE 1 TABLET DAILY. 90 tablet 1  . multivitamin-lutein (OCUVITE-LUTEIN) CAPS capsule Take 1 capsule by mouth daily.    . temazepam (RESTORIL) 30 MG capsule Take 30 mg by mouth at bedtime as needed. For sleep.     [name]. torsemide (DEMADEX) 20 MG tablet Take 4 tablets (80 mg total) by mouth daily. 360 tablet 3  . warfarin (COUMADIN) 4 MG tablet Take 4 mg by mouth daily.      No current facility-administered medications for this encounter.    Allergies:   No Known Allergies  Social History:  The patient  reports that he has never smoked. He has never used smokeless tobacco. He reports that he drinks about 4.2 oz of alcohol per week. He reports that he does not use illicit drugs.   Family History:  The patient's family history includes Cancer in his sister; Heart  failure in his father; Stroke in his mother.   ROS:  Please see the history of present illness.  No nausea, vomiting.  No fevers, chills.  No focal weakness.  No dysuria. Fatigue, DOE as noted above. Edema.   All other systems reviewed and negative.   PHYSICAL EXAM: VS:  BP 132/82 mmHg  Pulse 80  Resp 20  Wt 204 lb 4 oz (92.647 kg)  SpO2 86% RA General: Elderly frail male sitting in WC HEENT: normal  Neck: JVP 10 cm, no carotid bruits Cardiac: normal S1, S2; Irregularly irregular  Loud P2 + RV lift. Widely split s2, 2/6 HSM LLSB Lungs: decreased breath sounds throughout worse in LLL. No wheezing Abd: soft, nontender, no hepatomegaly  Ext: 2+ edema to knees bilaterally Skin: warm and dry  Neuro: no focal abnormalities noted Psych: normal affect  ASSESSMENT AND PLAN:  1) Severe pulmonary HTN with cor pulmonale:  Etiology remains somewhat uncertain.  V/Q scan showed no PE.  PFTs were abnormal but were reviewed by Dr Kendrick Fries and not thought to show significant obstruction or evidence for interstitial lung disease.  Evidence for granulomatous disease was not extensive and was thought to be possibly due to histoplasmosis given time living in [place]. It is possible that chronic bilateral pleural effusions may play a role in pulmonary hypertension via mechanical compression.  Also, cannot rule out component of pulmonary venous hypertension.  Not definite group 3 pulmonary hypertension based on pulmonary evaluation.   - I agree with Dr Kendrick Fries that RHC and pulmonary vasodilators are not likely to help much in this situation given advanced cor pulmonale, poor functional status, and advanced age.  I will discuss this with Dr. Kendrick Fries.  2) Chronic respiratory failure: Etiology uncertain.  There is not much evidence for lung parenchymal disease (see Dr Ulyses Jarred last note).  - He should continue oxygen at all times => as sats still low, will increase to 4L at rest and 5L with exertion.   3)  Left>right pleural effusion chronically: Since at least 2012.  This was reviewed by Dr Kendrick Fries who is hesitant to do thoracentesis given chronic effusions and risk of entrapped lung with thoracentesis.  Thinks this may do more harm that good.  Patient continues to want a thoracentesis done. I told him to discuss again with Dr Kendrick Fries when he follows up this month.   4) h/o pulmonary embolism 2006: On chronic warfarin.  5) Atrial fibrillation, persistent. He is on warfarin, rate is reasonably controlled on Toprol XL.  6) Chronic diastolic HF with cor pulmonale: Patient continues to have significant volume overload, probably mainly RV failure.   - BMET/BNP today.  - Increase torsemide to 80 mg daily with BMET 2-3 weeks, followup in 1 month.  7) Granulomatous disease on chest CT: Possible histoplasmosis given prior residence in [place].  Dalton McLean,MD 10/25/2014

## 2014-11-01 ENCOUNTER — Encounter: Payer: Self-pay | Admitting: Pulmonary Disease

## 2014-11-01 ENCOUNTER — Ambulatory Visit (INDEPENDENT_AMBULATORY_CARE_PROVIDER_SITE_OTHER): Payer: Medicare Other | Admitting: Pulmonary Disease

## 2014-11-01 VITALS — BP 122/68 | HR 76

## 2014-11-01 DIAGNOSIS — J948 Other specified pleural conditions: Secondary | ICD-10-CM

## 2014-11-01 DIAGNOSIS — J90 Pleural effusion, not elsewhere classified: Secondary | ICD-10-CM

## 2014-11-01 DIAGNOSIS — I272 Pulmonary hypertension, unspecified: Secondary | ICD-10-CM

## 2014-11-01 DIAGNOSIS — I270 Primary pulmonary hypertension: Secondary | ICD-10-CM

## 2014-11-01 MED ORDER — METOLAZONE 5 MG PO TABS: 5.0000 mg | ORAL_TABLET | Freq: Every day | ORAL | Status: DC

## 2014-11-01 NOTE — Assessment & Plan Note (Signed)
Jason Ritter says his symptoms of been about the same since the last visit. He continues to have dyspnea with any moderate exertion and his oxygen requirements have been fairly stable (4 L at rest, 5 L on exertion). Today we had a very lengthy conversation about his pulmonary hypertension and his prognosis. I explained to him that I do not have a great answer for why he has pulmonary hypertension though I believe it is somehow related to his pleural effusion. Further, the likelihood that he has WHO class I disease is quite low and therefore its exceedingly unlikely that he would benefit from a pulmonary vasodilator. Beyond that, he is profoundly deconditioned and so even if he did take a pulmonary vasodilator which would improve his cardiac output his overall health is so poor that I think it's very unlikely he would get any sort of functional benefit from it. After a more than 30 minute conversation in clinic today he told me that he understands and he doesn't feel that it would be worth proceeding with a right heart catheterization or pulmonary vasodilators.  Today he does seem a bit volume overloaded as his legs are more swollen and he does have some crackles on exam, so perhaps he could benefit from more aggressive diuresis.  Plan: -Continue torsemide -Add Zaroxolyn for 3 days, if he gets benefit from this and I will check a bmet next week and will arrange for a longer term prescription of this diuretic -Follow-up 3 months

## 2014-11-01 NOTE — Patient Instructions (Signed)
Take metolazone 30 minutes before the torsemide Let us know if it helps you breathe better or if your swelling improves We will see you back in 8-10 weeks (30 minute visit) or sooner if needed

## 2014-11-01 NOTE — Progress Notes (Signed)
Subjective:    Patient ID: Jason Ritter N Bartosiewicz, male    DOB: 11-Feb-1929, 81 y.o.   MRN: [id number]  Synopsis: Jason Ritter was first saw the [facility] in January 2016 for evaluation of pulmonary hypertension. He has a long-standing left-sided moderate to large pleural effusion of undetermined etiology that is been present for over 7 years. He had a pulmonary embolism in 2006 and has been on lifelong anticoagulant ever since then.  An echocardiogram showed severe dilatation of ERV and an elevated RVSP.   HPI  Chief Complaint  Patient presents with  . Follow-up    pt has no breathing complaints.  is using 4lpm 02 in the house, 5lpm with exertion.     Jason Ritter has been using more oxygen since our last visit. He says that yesterday in clinic he had an O2 saturation of 88% on room air.  He has no particular copmlaints but he notes that he continues to have dyspnea on exertion and a lack of stamina.   However, he says that he continues to live a sedentary life and he has no problems with breathing in his regular day-to-day activity. He says that his leg swelling has been a bit worse in the last several days, but his breathing has been unchanged. He continues to have some fatigue from day-to-day.   Past Medical History  Diagnosis Date  . CHF (congestive heart failure)   . Afib   . Shingles   . Pulmonary embolism   . Hyperlipidemia   . Glaucoma   . Hypothyroidism   . Hypertension        Review of Systems  Constitutional: Positive for fatigue. Negative for fever and chills.  HENT: Negative for postnasal drip, rhinorrhea and sinus pressure.   Respiratory: Positive for shortness of breath. Negative for cough and stridor.   Cardiovascular: Positive for leg swelling. Negative for chest pain and palpitations.       Objective:   Physical Exam Filed Vitals:   11/01/14 1429  BP: 122/68  Pulse: 76  SpO2: 96%   4L [place]  Gen: chronically ill appearing, no acute distress HEENT: NCAT, EOMi,  OP clear PULM: diminished left base, few crackles in bases CV: RRR, systolic murmur, no JVD AB: BS+, soft, nontender Ext: warm, massive pitting edema legs bilaterally, no clubbing, no cyanosis Derm: no rash or skin breakdown Neuro: A&Ox4, MAEW  2015 pulmonary function test > ratio 77%, FEV1 1.20 L (39% predicted), total lung capacity 4.37 L (58% predicted), DLCO 11.13 (31% predicted) 2015 CT chest> (I have personally reviewed these images ) chronic left-sided pleural effusion again noted, multiple nodules in the right lung both pleural-based and basilar in nature, some are calcified, all are well-circumscribed and round suggestive of chronic granulomatous disease, no other clear pulmonary parenchymal abnormality identified I reviewed Dr. Alford HighlandMcLean's notes in detail 2015 VQ scan> low risk for pulmonary embolism, chronic left pleural effusion noted     Assessment & Plan:   Pulmonary hypertension Jason Ritter says his symptoms of been about the same since the last visit. He continues to have dyspnea with any moderate exertion and his oxygen requirements have been fairly stable (4 L at rest, 5 L on exertion). Today we had a very lengthy conversation about his pulmonary hypertension and his prognosis. I explained to him that I do not have a great answer for why he has pulmonary hypertension though I believe it is somehow related to his pleural effusion. Further, the likelihood that he has WHO  class I disease is quite low and therefore its exceedingly unlikely that he would benefit from a pulmonary vasodilator. Beyond that, he is profoundly deconditioned and so even if he did take a pulmonary vasodilator which would improve his cardiac output his overall health is so poor that I think it's very unlikely he would get any sort of functional benefit from it. After a more than 30 minute conversation in clinic today he told me that he understands and he doesn't feel that it would be worth proceeding with a right heart  catheterization or pulmonary vasodilators.  Today he does seem a bit volume overloaded as his legs are more swollen and he does have some crackles on exam, so perhaps he could benefit from more aggressive diuresis.  Plan: -Continue torsemide -Add Zaroxolyn for 3 days, if he gets benefit from this and I will check a bmet next week and will arrange for a longer term prescription of this diuretic -Follow-up 3 months   Pleural effusion on left I again explained to him today in clinic that the left-sided pleural effusion is of uncertain etiology. However, it has been stable for over 7 years. I explained that the likelihood of lung entrapment is quite high and it is very unlikely that he would see benefit from a thoracentesis and it is quite likely that he would have a complication such as reexpansion pulmonary edema. Therefore I do not recommend thoracentesis.    Updated Medication List Outpatient Encounter Prescriptions as of 11/01/2014  Medication Sig  . AZOPT 1 % ophthalmic suspension Place 1 drop into the right eye 2 (two) times daily.   [name] docusate sodium (COLACE) 100 MG capsule Take 200 mg by mouth at bedtime.  [name] levothyroxine (SYNTHROID, LEVOTHROID) 112 MCG tablet Take 112 mcg by mouth daily.    [name] lisinopril (PRINIVIL,ZESTRIL) 5 MG tablet TAKE 1 TABLET ONCE DAILY.  [name] LUMIGAN 0.01 % SOLN Place 1 drop into the right eye at bedtime.   . Melatonin 3 MG CAPS Take 3 mg by mouth at bedtime.  . metoprolol succinate (TOPROL-XL) 100 MG 24 hr tablet TAKE 1 TABLET DAILY.  . multivitamin-lutein (OCUVITE-LUTEIN) CAPS capsule Take 1 capsule by mouth daily.  . temazepam (RESTORIL) 30 MG capsule Take 30 mg by mouth at bedtime as needed. For sleep.   [name] torsemide (DEMADEX) 20 MG tablet Take 4 tablets (80 mg total) by mouth daily.  [name] warfarin (COUMADIN) 4 MG tablet Take 4 mg by mouth daily.   . metolazone (ZAROXOLYN) 5 MG tablet Take 1 tablet (5 mg total) by mouth daily.

## 2014-11-01 NOTE — Assessment & Plan Note (Signed)
I again explained to him today in clinic that the left-sided pleural effusion is of uncertain etiology. However, it has been stable for over 7 years. I explained that the likelihood of lung entrapment is quite high and it is very unlikely that he would see benefit from a thoracentesis and it is quite likely that he would have a complication such as reexpansion pulmonary edema. Therefore I do not recommend thoracentesis.

## 2014-11-06 ENCOUNTER — Telehealth: Payer: Self-pay | Admitting: Pulmonary Disease

## 2014-11-06 DIAGNOSIS — I10 Essential (primary) hypertension: Secondary | ICD-10-CM

## 2014-11-06 NOTE — Telephone Encounter (Signed)
Spoke with pt, states that taking the Metolazone before his torsemide has helped with his extremity swelling.     Dr. Kendrick FriesMcQuaid do you patient to continue taking this before his torsemide?  Thanks!

## 2014-11-08 MED ORDER — METOLAZONE 5 MG PO TABS: ORAL_TABLET | ORAL | Status: DC

## 2014-11-08 NOTE — Telephone Encounter (Signed)
Yes, but please have him get a BMET Refill 12 tablets, tell him to take MWF

## 2014-11-08 NOTE — Telephone Encounter (Signed)
Called and spoke with pt and he is aware of BQ recs and of the refill that was sent to the pharmacy.  Lab order has been placed and pt is aware.

## 2014-11-09 ENCOUNTER — Ambulatory Visit (HOSPITAL_COMMUNITY)
Admission: RE | Admit: 2014-11-09 | Discharge: 2014-11-09 | Disposition: A | Discharge status: Home / Self Care | Payer: Medicare Other | Source: Ambulatory Visit | Attending: Internal Medicine | Admitting: Internal Medicine

## 2014-11-09 ENCOUNTER — Encounter (HOSPITAL_COMMUNITY): Payer: Self-pay

## 2014-11-09 VITALS — BP 101/64 | HR 85 | Resp 22 | Wt 194.4 lb

## 2014-11-09 DIAGNOSIS — E785 Hyperlipidemia, unspecified: Secondary | ICD-10-CM | POA: Diagnosis not present

## 2014-11-09 DIAGNOSIS — I5032 Chronic diastolic (congestive) heart failure: Secondary | ICD-10-CM

## 2014-11-09 DIAGNOSIS — Z823 Family history of stroke: Secondary | ICD-10-CM | POA: Diagnosis not present

## 2014-11-09 DIAGNOSIS — I272 Other secondary pulmonary hypertension: Secondary | ICD-10-CM | POA: Diagnosis not present

## 2014-11-09 DIAGNOSIS — Z7901 Long term (current) use of anticoagulants: Secondary | ICD-10-CM | POA: Diagnosis not present

## 2014-11-09 DIAGNOSIS — N183 Chronic kidney disease, stage 3 (moderate): Secondary | ICD-10-CM | POA: Insufficient documentation

## 2014-11-09 DIAGNOSIS — I482 Chronic atrial fibrillation, unspecified: Secondary | ICD-10-CM

## 2014-11-09 DIAGNOSIS — I129 Hypertensive chronic kidney disease with stage 1 through stage 4 chronic kidney disease, or unspecified chronic kidney disease: Secondary | ICD-10-CM | POA: Diagnosis not present

## 2014-11-09 DIAGNOSIS — Z79899 Other long term (current) drug therapy: Secondary | ICD-10-CM | POA: Insufficient documentation

## 2014-11-09 DIAGNOSIS — J9610 Chronic respiratory failure, unspecified whether with hypoxia or hypercapnia: Secondary | ICD-10-CM | POA: Insufficient documentation

## 2014-11-09 DIAGNOSIS — R0602 Shortness of breath: Secondary | ICD-10-CM

## 2014-11-09 DIAGNOSIS — D6859 Other primary thrombophilia: Secondary | ICD-10-CM | POA: Diagnosis not present

## 2014-11-09 DIAGNOSIS — I2781 Cor pulmonale (chronic): Secondary | ICD-10-CM | POA: Diagnosis not present

## 2014-11-09 DIAGNOSIS — E039 Hypothyroidism, unspecified: Secondary | ICD-10-CM | POA: Insufficient documentation

## 2014-11-09 DIAGNOSIS — I270 Primary pulmonary hypertension: Secondary | ICD-10-CM

## 2014-11-09 DIAGNOSIS — J90 Pleural effusion, not elsewhere classified: Secondary | ICD-10-CM

## 2014-11-09 DIAGNOSIS — J948 Other specified pleural conditions: Secondary | ICD-10-CM

## 2014-11-09 DIAGNOSIS — Z86711 Personal history of pulmonary embolism: Secondary | ICD-10-CM | POA: Insufficient documentation

## 2014-11-09 LAB — BASIC METABOLIC PANEL
Anion gap: 16 — ABNORMAL HIGH (ref 5–15)
BUN: 94 mg/dL — ABNORMAL HIGH (ref 6–23)
CHLORIDE: 81 mmol/L — AB (ref 96–112)
CO2: 34 mmol/L — ABNORMAL HIGH (ref 19–32)
Calcium: 9 mg/dL (ref 8.4–10.5)
Creatinine, Ser: 2.93 mg/dL — ABNORMAL HIGH (ref 0.50–1.35)
GFR, EST AFRICAN AMERICAN: 21 mL/min — AB (ref >90)
GFR, EST NON AFRICAN AMERICAN: 18 mL/min — AB (ref >90)
GLUCOSE: 101 mg/dL — AB (ref 70–99)
POTASSIUM: 3.3 mmol/L — AB (ref 3.5–5.1)
Sodium: 131 mmol/L — ABNORMAL LOW (ref 135–145)

## 2014-11-09 LAB — BRAIN NATRIURETIC PEPTIDE: B Natriuretic Peptide: 295.3 pg/mL — ABNORMAL HIGH (ref 0.0–100.0)

## 2014-11-09 MED ORDER — METOLAZONE 2.5 MG PO TABS: 2.5000 mg | ORAL_TABLET | Freq: Every day | ORAL | Status: AC | PRN

## 2014-11-09 NOTE — Progress Notes (Signed)
Patient ID: Jason Ritter, male   DOB: October 30, 1928, 81 y.o.   MRN: [id number]   Date:  11/09/2014  ID:  Jason Ritter, DOB 02/13/1929, MRN [id number] PCP:  Pearla Dubonnet, MD     History of Present Illness: Jason Ritter is a 81 y.o. 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  He has been followed by Dr. Eldridge Dace. He was referred for further evaluation of pulmonary HTN on recent echo.   Had PE in 2006 and found to have ATIII deficiency. Took coumadin. In 2012 found to have diastolic HF.  Echo 4/12 RVSP 46 mmHG. Developed left pleural effusion at that time (2012). Did not have thoracentesis. Last chest CT in 2013 as below with moderate to large left pleural effusion and granulomatous disease. No PE.   Myoview 5/12. ? Mild reversible defect in mid-anterolateral wall EF 65%. Managed medically.   Dr. Kevan Ny saw him recently and obtained echo. There was severe pulmonary HTN and RV dysfunction.   Echo 10/15:  - Left ventricle: The cavity size was normal. There was mild focal basal hypertrophy of the septum. Systolic function was normal. The estimated ejection fraction was in the range of 55% to 60%. Wall motion was normal; there were no regional wall motion abnormalities. - Ventricular septum: The contour showed diastolic flattening and systolic flattening. - Aortic root: The aortic root was mildly dilated. - Left atrium: The atrium was mildly dilated. - Right ventricle: The cavity size was severely dilated. Systolic function was severely reduced. - Right atrium: The atrium was severely dilated. - Pulmonary arteries: Systolic pressure was severely increased. PA peak pressure: 83 mm Hg (S). - Pericardium, extracardiac: There was a left pleural effusion.  Chest CT 3/13:  1. No PE 2. Little change in moderate to large left pleural effusion with dense  passive atelectasis of the part of the left lower lobe and lingula. 3. Stable primarily right lung nodules. No definite new or enlarging nodule. 4. Slight irregularity of the mucosa of the mid distal esophagus. Consider barium swallow to assess further. 5. Cardiomegaly and coronary artery calcifications. 6. Calcified granulomas and right hilar nodes consistent with prior granulomatous disease.  After initial appointment, he had CXR done showing chronic R>L pleural effusions.  PFTs were interpreted as showing severe obstructive airways disease, severe restriction, and severe diffusion defect.  V/Q scan in 12/15 was low probability for PE.    He was referred to pulmonary (Dr Kendrick Fries) for further evaluation of lung disease and possible Group 3 pulmonary hypertension.  Dr Kendrick Fries reviewed PFTs and prior CT.  He thought that there was no clear airflow obstruction and that the parenchyma was fairly clear.  There was mild scarring on the CT suggestive of granulomatous disease that may be prior histoplasmosis given residence in [place] in the past.  He did not feel that there was clear evidence for group 3 pulmonary hypertension.  The chronic pleural effusions may play a role via mechanical compression.  He thought that there would probably be no significant benefit from RHC and pulmonary vasodilators given age and limited function. He thought that the pleural effusion would be hard to drain and would risk entrapped lung, could do more harm than good to try thoracentesis.   Currently able to get around the house and do ADLs without difficulty. Can walk in store but gets weak and tired in his legs. Gets fatigued after  walking 40 yards. + severe edema. Denies chest pain, palpitations, syncope, fall, shortness breath at rest, orthopnea, PND. No bleeding problems. Weight stable. He was transitioned to torsemide at last appointment.  Weight is down 4 lbs but still has a lot of edema.  Wearing oxygen at all  times, oxygen saturation 86% when he walked in today.   Weigh down 10 pounds to   Labs  3/14: K 4.6 Creatinine 1.50 Labs  5/14: K 3.8 Creatinine 1.24 Labs 12/15: K 4.2, creatinine 1.2, BNP 294   Wt Readings from Last 3 Encounters:  09/25/14 211 lb 1.9 oz (95.763 kg)  08/21/14 204 lb 1.9 oz (92.588 kg)  03/30/14 209 lb (94.802 kg)     Past Medical History  Diagnosis Date  . CHF (congestive heart failure)   . Afib   . Shingles   . Pulmonary embolism   . Hyperlipidemia   . Glaucoma   . Hypothyroidism   . Hypertension     Current Outpatient Prescriptions  Medication Sig Dispense Refill  . AZOPT 1 % ophthalmic suspension Place 1 drop into the right eye 2 (two) times daily.     [name]. docusate sodium (COLACE) 100 MG capsule Take 200 mg by mouth at bedtime.    [name]. levothyroxine (SYNTHROID, LEVOTHROID) 112 MCG tablet Take 112 mcg by mouth daily.      [name]. lisinopril (PRINIVIL,ZESTRIL) 5 MG tablet TAKE 1 TABLET ONCE DAILY. 90 tablet 1  . LUMIGAN 0.01 % SOLN Place 1 drop into the right eye at bedtime.     . Melatonin 3 MG CAPS Take 3 mg by mouth at bedtime.    . metolazone (ZAROXOLYN) 5 MG tablet Take on Monday, Wednesday and friday 12 tablet 0  . metoprolol succinate (TOPROL-XL) 100 MG 24 hr tablet TAKE 1 TABLET DAILY. 90 tablet 1  . multivitamin-lutein (OCUVITE-LUTEIN) CAPS capsule Take 1 capsule by mouth daily.    . temazepam (RESTORIL) 30 MG capsule Take 30 mg by mouth at bedtime as needed. For sleep.     [name]. torsemide (DEMADEX) 20 MG tablet Take 4 tablets (80 mg total) by mouth daily. 360 tablet 3  . warfarin (COUMADIN) 4 MG tablet Take 4 mg by mouth daily.      No current facility-administered medications for this encounter.    Allergies:   No Known Allergies  Social History:  The patient  reports that he has never smoked. He has never used smokeless tobacco. He reports that he drinks about 4.2 oz of alcohol per week. He reports that he does not use illicit drugs.   Family History:   The patient's family history includes Cancer in his sister; Heart failure in his father; Stroke in his mother.   ROS:  Please see the history of present illness.  No nausea, vomiting.  No fevers, chills.  No focal weakness.  No dysuria. Fatigue, DOE as noted above. Edema.   All other systems reviewed and negative.   PHYSICAL EXAM: VS:  BP 101/64 mmHg  Pulse 85  Resp 22  Wt 194 lb 6 oz (88.168 kg)  SpO2 88% RA General: Elderly frail male sitting in WC HEENT: normal  Neck: JVP 7-8 cm, no carotid bruits Cardiac: normal S1, S2; Irregularly irregular  Loud P2 + RV lift. Widely split s2, 2/6 HSM LLSB Lungs: decreased breath sounds throughout worse in LLL. No wheezing Abd: soft, nontender, no hepatomegaly  Ext: tr edema to knees bilaterally Skin: warm and dry  Neuro: no focal abnormalities noted  Psych: normal affect  ASSESSMENT AND PLAN:  1) Severe pulmonary HTN with cor pulmonale: PFTs reviewed and show severe lung disease. I suspect PAH related primarily to chronic hypoxemia and effusion as well as a component of diastolic HF. (WHO III & WHO II)  V/Q scan showed no PE. Evidence for granulomatous disease was not extensive and was thought to be possibly due to histoplasmosis given time living in [place].  - I agree with Drs. Mclean and McQuaid that RHC and pulmonary vasodilators are not likely to help much in this situation given advanced cor pulmonale, poor functional status, and advanced age.  - He continues having a very difficult time trying to understand his diagnosis and coming to terms with his prognosis. He also continues to imply that his previous doctors let this go to long and our team has missed the real diagnosis. I spent a long time explainig his condition to him as best as I could though I could not give him a reason why his PFTs looked so bad. I also was quite frank about his one to two year prognosis being quite poor. I offered him a second opinion with Dr. Katrine Coho at [facility] but  he refuses at this point - Stressed need to control volume status and keep sats >= 90% at all times however this is complicated by inability to track sats on his fingers with pulse ox. - I suggested Pulmonary Rehab but he wants to think about it.  2) Chronic respiratory failure: Followed by Dr. Kendrick Fries.    - He should continue oxygen at all times => as sats still low, will increase to 4L at rest and 5L with exertion.   3) Left>right pleural effusion chronically: Since at least 2012.  This was reviewed by Dr Kendrick Fries who is hesitant to do thoracentesis given chronic effusions and risk of entrapped lung with thoracentesis.  Thinks this may do more harm that good.  Patient continues to want a thoracentesis done. I told him to discuss again with Dr Kendrick Fries when he follows up this month.   4) h/o pulmonary embolism 2006: On chronic warfarin.  5) Atrial fibrillation, persistent. He is on warfarin, rate is reasonably controlled on Toprol XL.  6) Chronic diastolic HF with cor pulmonale: Volume status much improved. I worry he may be overdiuresed.  - BMET/BNP today.  - Adjust diuretics based on labs 7) Granulomatous disease on chest CT: Possible histoplasmosis given prior residence in [place].  Truman Hayward 11/09/2014

## 2014-11-09 NOTE — Patient Instructions (Signed)
We will refer you to pulmonary rehab. They will call you to arrange initial appointment.  Take metolazone tablet only if weight greater than 200 lbs.  Follow up 1 month.  Do the following things EVERYDAY: 1) Weigh yourself in the morning before breakfast. Write it down and keep it in a log. 2) Take your medicines as prescribed 3) Eat low salt foods-Limit salt (sodium) to 2000 mg per day.  4) Stay as active as you can everyday 5) Limit all fluids for the day to less than 2 liters

## 2014-11-20 ENCOUNTER — Telehealth (HOSPITAL_COMMUNITY): Payer: Self-pay

## 2014-11-20 NOTE — Telephone Encounter (Signed)
Called patient to discuss pulmonary rehab.  Patient said "no" and hung up.  Sent letter to patient on 11/16/14 regarding pulmonary rehab.

## 2014-12-12 ENCOUNTER — Encounter (HOSPITAL_COMMUNITY): Payer: Medicare Other

## 2015-01-03 ENCOUNTER — Ambulatory Visit: Payer: Medicare Other | Admitting: Pulmonary Disease

## 2015-01-26 ENCOUNTER — Other Ambulatory Visit: Payer: Self-pay | Admitting: Interventional Cardiology

## 2015-02-19 ENCOUNTER — Other Ambulatory Visit: Payer: Self-pay | Admitting: Interventional Cardiology

## 2015-07-19 ENCOUNTER — Other Ambulatory Visit: Payer: Self-pay | Admitting: Cardiology

## 2015-08-14 DEATH — deceased

## 2016-11-29 IMAGING — NM NM PULMONARY VENT & PERF
16 series · 16 of 16 positions shown · non-contrast
Comparison: Chest x-ray, same date and prior chest CT 12/08/2011

CLINICAL DATA: Shortness of breath. Heart failure. Stage 3 kidney
disease.

EXAM:
NUCLEAR MEDICINE VENTILATION - PERFUSION LUNG SCAN
TECHNIQUE: Ventilation images were obtained in multiple projections using
inhaled aerosol technetium 99 M DTPA. Perfusion images were obtained
in multiple projections after intravenous injection of 2c-AAm MAA.
RADIOPHARMACEUTICALS:  40.0 mCi 2c-AAm DTPA aerosol and 6.0 mCi
2c-AAm MAA

[Series 1: ant/post vent · 4.14mm/px · 1 of 1 slices shown (1 of 2)]
[im 1/1]
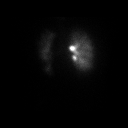

[Series 1: ant/post vent · 4.14mm/px · 1 of 1 slices shown (2 of 2)]
[im 1/1]
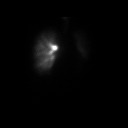

[Series 2: lao/rpo vent · 4.14mm/px · 1 of 1 slices shown (1 of 2)]
[im 1/1]
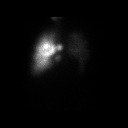

[Series 2: lao/rpo vent · 4.14mm/px · 1 of 1 slices shown (2 of 2)]
[im 1/1]
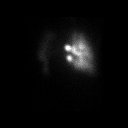

[Series 3: lpo/rao vent · 4.14mm/px · 1 of 1 slices shown (1 of 2)]
[im 1/1]
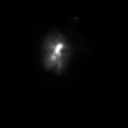

[Series 3: lpo/rao vent · 4.14mm/px · 1 of 1 slices shown (2 of 2)]
[im 1/1]
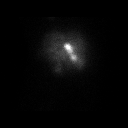

[Series 4: lt lat/rt lat vent · 4.14mm/px · 1 of 1 slices shown (1 of 2)]
[im 1/1]
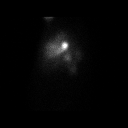

[Series 4: lt lat/rt lat vent · 4.14mm/px · 1 of 1 slices shown (2 of 2)]
[im 1/1]
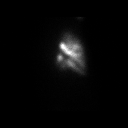

[Series 5: lt lat/rt lat perf · 4.14mm/px · 1 of 1 slices shown (1 of 2)]
[im 1/1]
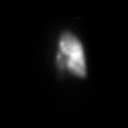

[Series 5: lt lat/rt lat perf · 4.14mm/px · 1 of 1 slices shown (2 of 2)]
[im 1/1]
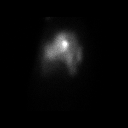

[Series 6: lpo/rao perf · 4.14mm/px · 1 of 1 slices shown (1 of 2)]
[im 1/1]
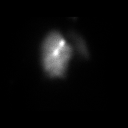

[Series 6: lpo/rao perf · 4.14mm/px · 1 of 1 slices shown (2 of 2)]
[im 1/1]
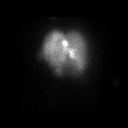

[Series 7: ant/post perf · 4.14mm/px · 1 of 1 slices shown (1 of 2)]
[im 1/1]
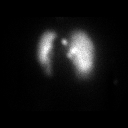

[Series 7: ant/post perf · 4.14mm/px · 1 of 1 slices shown (2 of 2)]
[im 1/1]
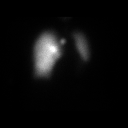

[Series 8: lao/rpo perf · 4.14mm/px · 1 of 1 slices shown (1 of 2)]
[im 1/1]
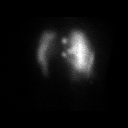

[Series 8: lao/rpo perf · 4.14mm/px · 1 of 1 slices shown (2 of 2)]
[im 1/1]
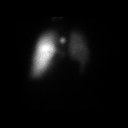

[16 of 16 positions shown; findings below may reference images not displayed]

FINDINGS: Ventilation: Decreased ventilation is noted involving the left long.
This is likely due to the chronic left pleural effusion and
compressive atelectasis. Patchy ventilation defects are noted in the
left lung.

Perfusion: Matching decreased perfusion in the left lung and small
matching peripheral defects. The right lung is normal.
IMPRESSION: Chronic left pleural effusion and compressive atelectasis with
matching decreased ventilation and perfusion in the left lung
compared with the right.

Small matching subsegmental ventilation-perfusion defects in the
left lung.

Low probability ventilation perfusion lung scan for pulmonary
embolism.
